# Patient Record
Sex: Female | Born: 1990 | Race: White | Hispanic: No | Marital: Married | Smoking: Current every day smoker
Health system: Southern US, Community
[De-identification: ages and names within clinical notes are randomized; demographics above are authoritative.]

## PROBLEM LIST (undated history)

## (undated) HISTORY — PX: OVARIAN CYST REMOVAL: SHX89

---
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
## 2009-02-27 ENCOUNTER — Emergency Department: Payer: Self-pay | Admitting: Emergency Medicine

## 2009-11-29 IMAGING — US US PELV - US TRANSVAGINAL
1 series · 17 of 25 positions shown · non-contrast
Comparison: none

REASON FOR EXAM: R pelvic pain, history of large ovarian cysts
COMMENTS:

PROCEDURE:     US  - US PELVIS MASS EXAM  - [DATE]  [DATE] [DATE]  [DATE]
RESULT:     Comparison: No available comparison exam.

[Series 1: us pelv - us transvaginal · 17 of 32 slices shown]
[im 1/32]
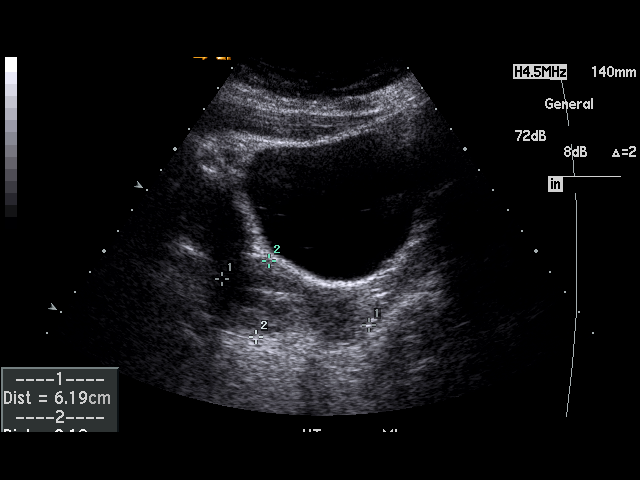
[im 3/32]
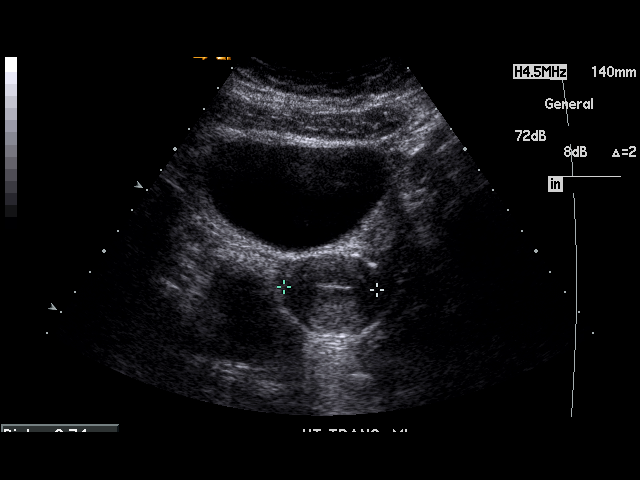
[im 4/32]
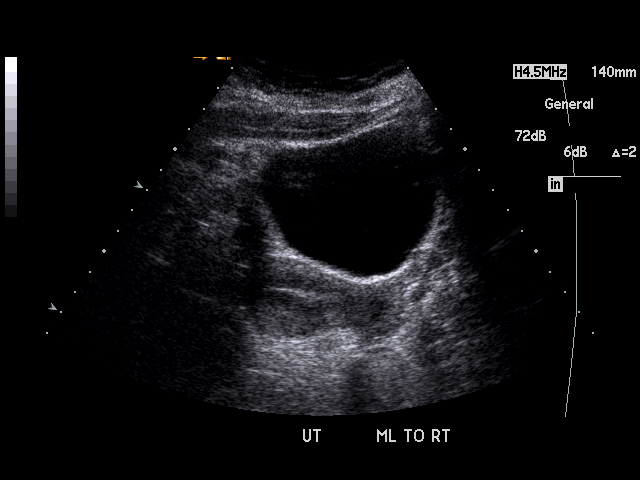
[im 7/32]
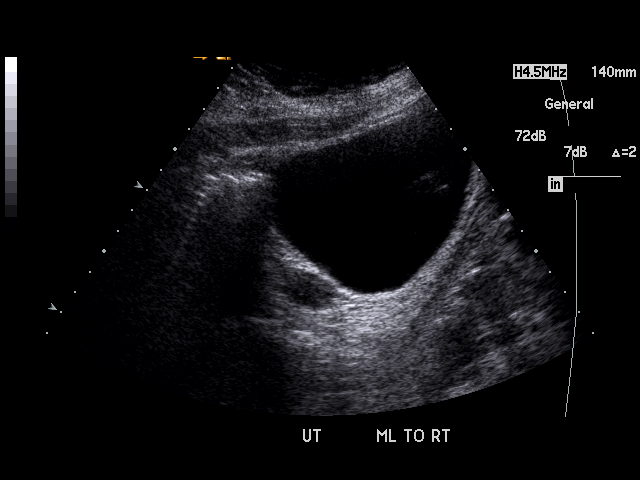
[im 8/32]
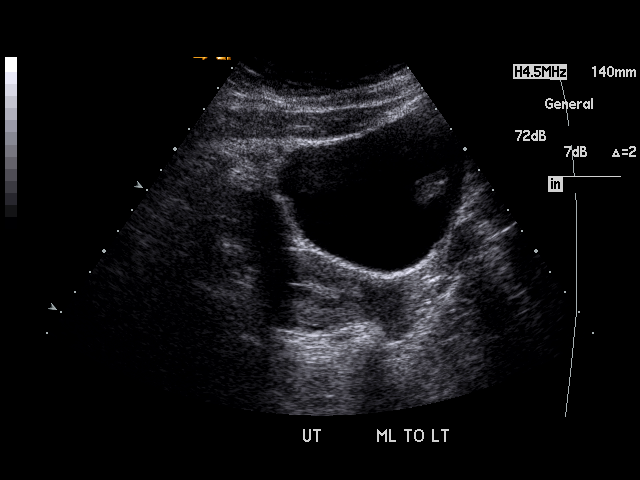
[im 11/32]
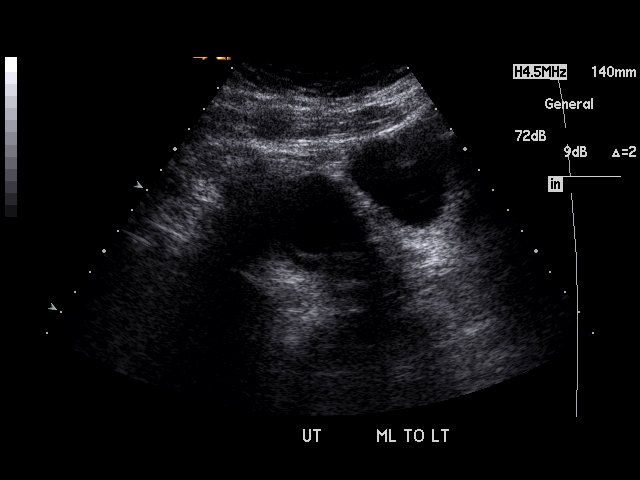
[im 12/32]
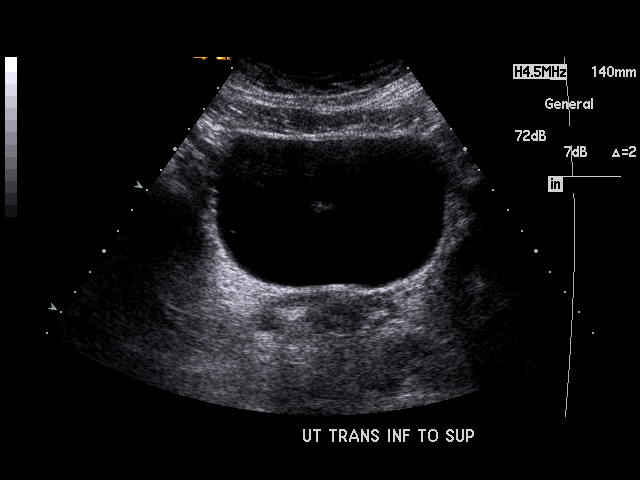
[im 15/32]
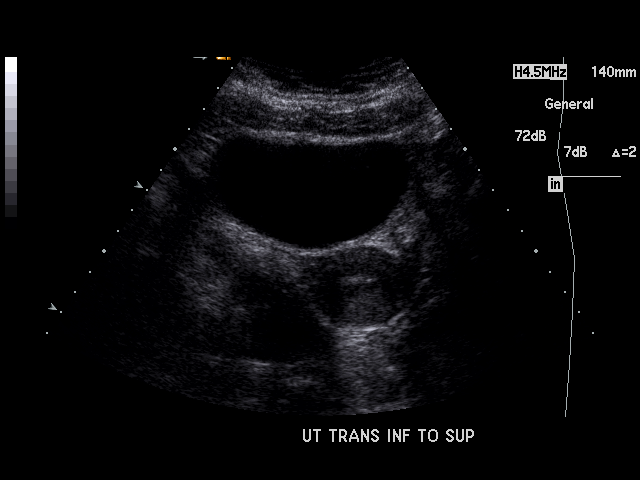
[im 16/32]
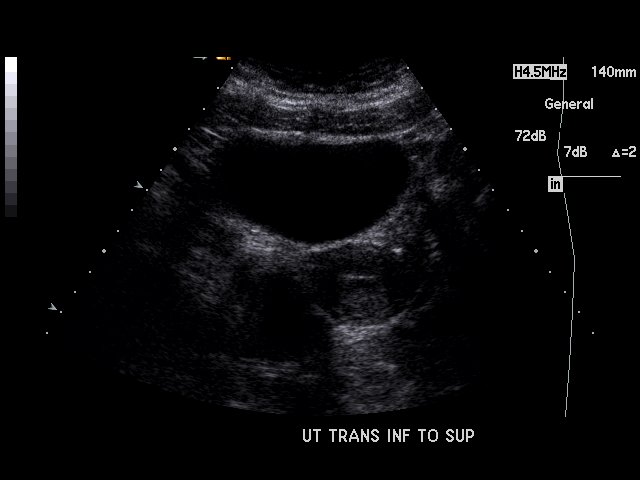
[im 17/32]
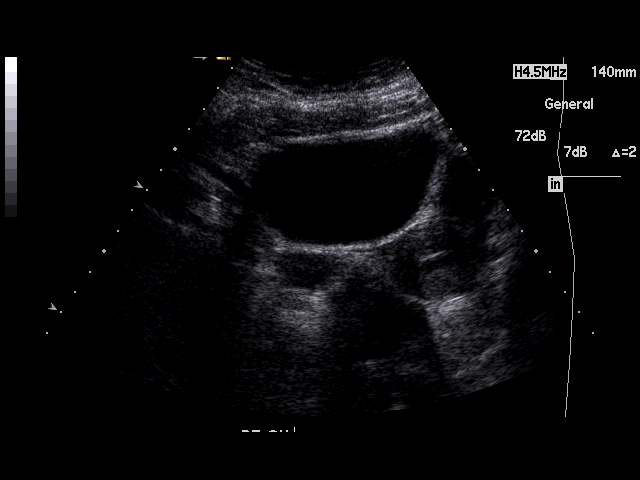
[im 20/32]
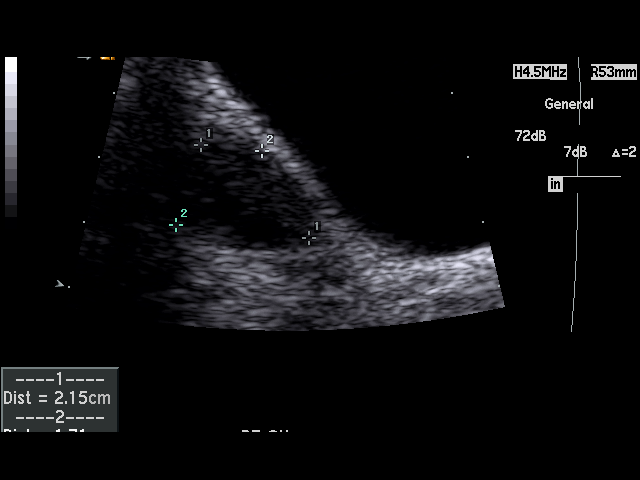
[im 21/32]
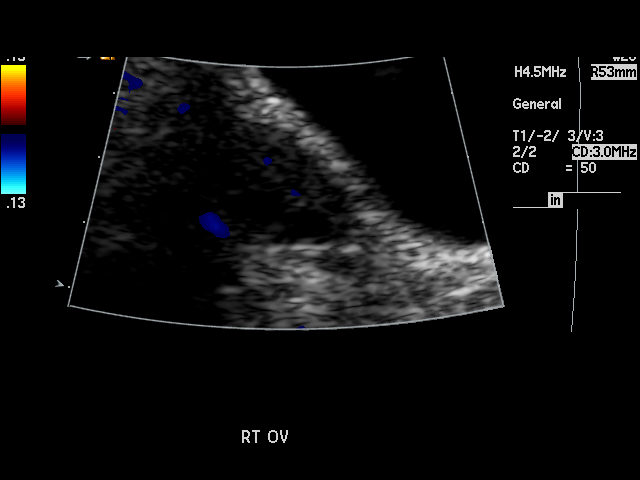
[im 24/32]
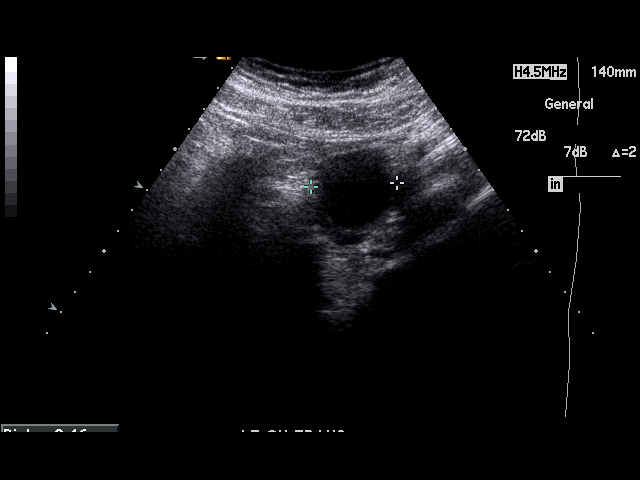
[im 25/32]
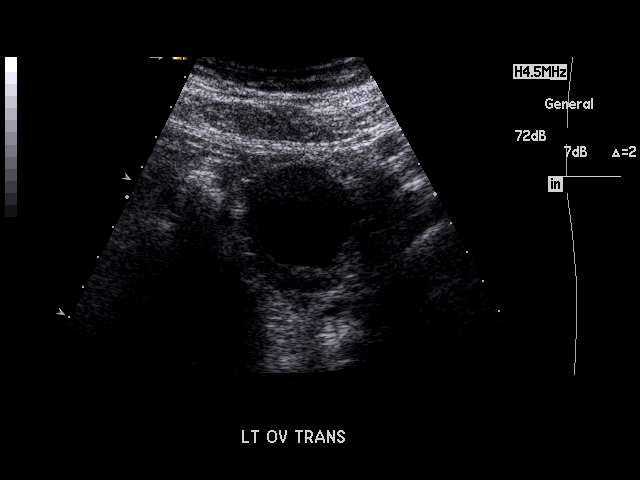
[im 28/32]
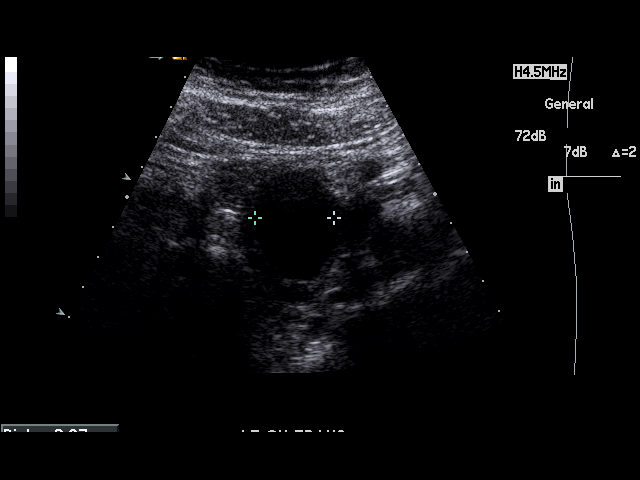
[im 29/32]
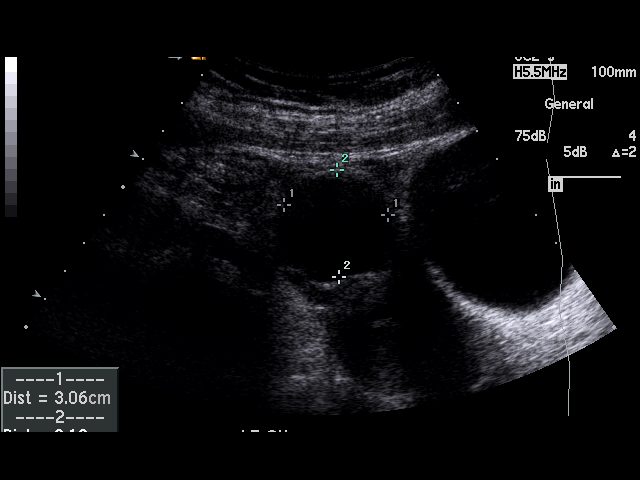
[im 32/32]
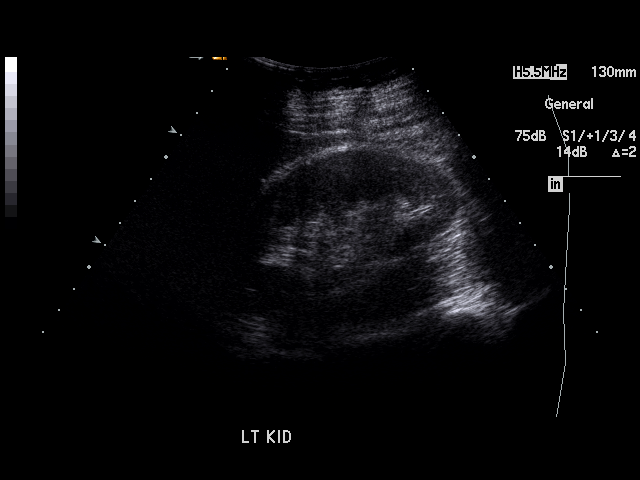

[17 of 25 positions shown; findings below may reference images not displayed]

FINDINGS: Transabdominal pelvic ultrasound was performed.

The uterus measures 6.2 x 3.1 x 3.7 cm. No focal mass is noted. The
endometrial strip measures 2 mm in thickness.

The right ovary measures 2.2 x 1.7 x 1.9 cm and the left measures 4.5 x
x 3.4 cm. A 3.1 x 3.1 x 2.4 cm left ovarian cyst is noted. Vascular flow is
seen in both ovaries. There is no significant pelvic free fluid.

There is no hydronephrosis.
IMPRESSION: 1. There is a 3.1 x 3.1 x 2.4 cm left ovarian cyst. The right ovary is
unremarkable.

Preliminary report was faxed to the emergency room by the night radiologist
shortly after the study was performed.

## 2009-12-24 IMAGING — US US PELV - US TRANSVAGINAL
1 series · 17 of 25 positions shown · non-contrast
Comparison: none

REASON FOR EXAM: pelvic pain  rm 17
COMMENTS:   LMP: unsure

[Series 1: us pelv - us transvaginal · 17 of 87 slices shown]
[im 1/87]
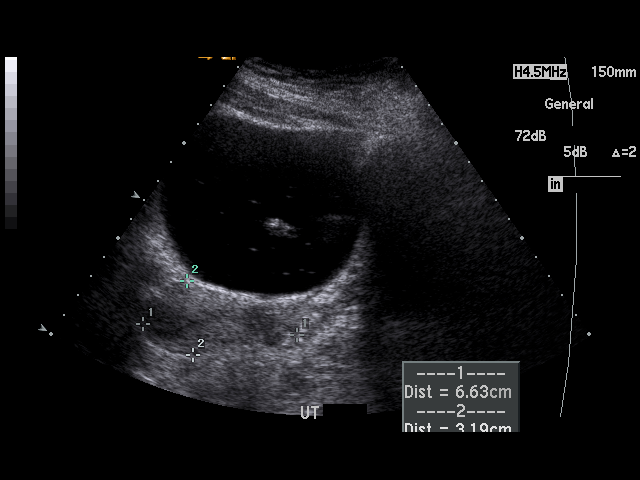
[im 8/87]
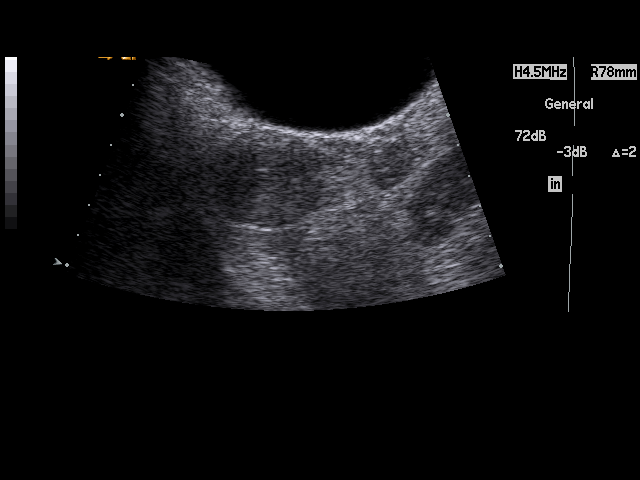
[im 11/87]
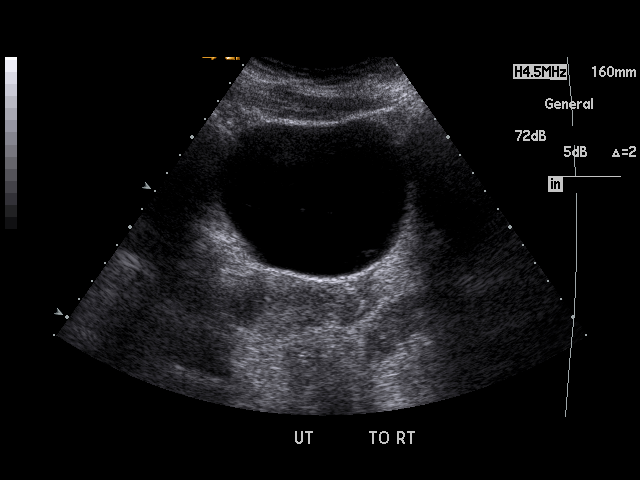
[im 18/87]
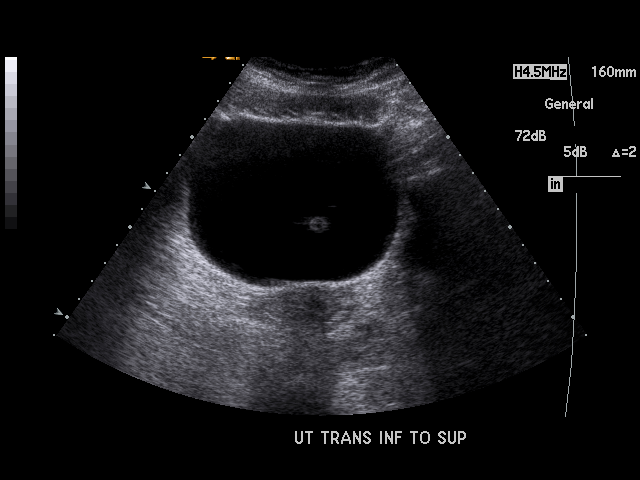
[im 22/87]
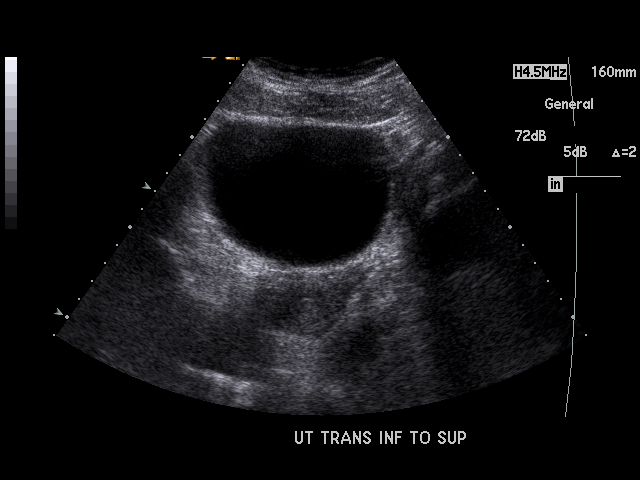
[im 29/87]
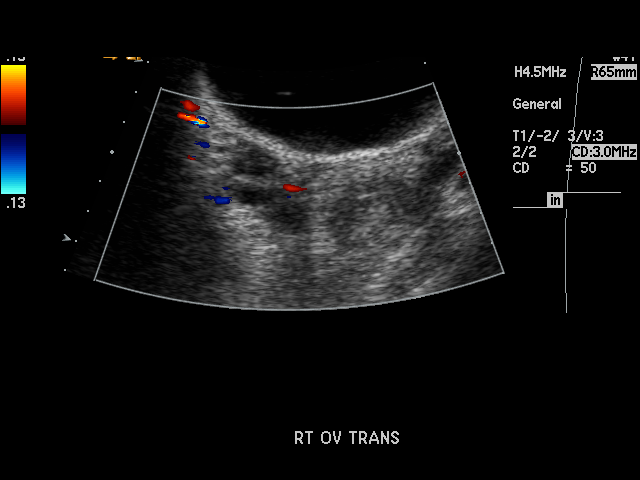
[im 33/87]
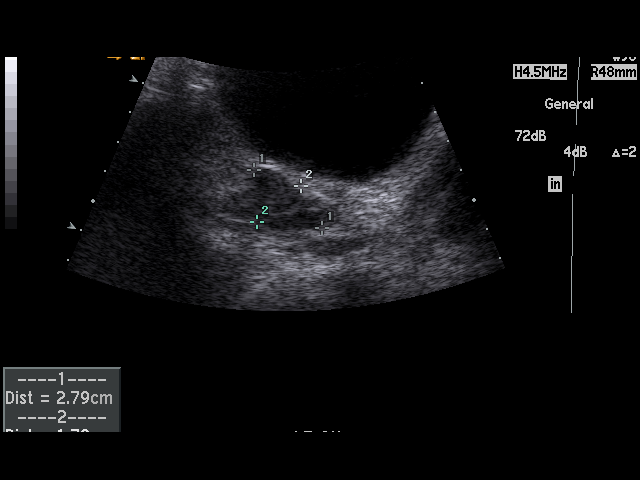
[im 40/87]
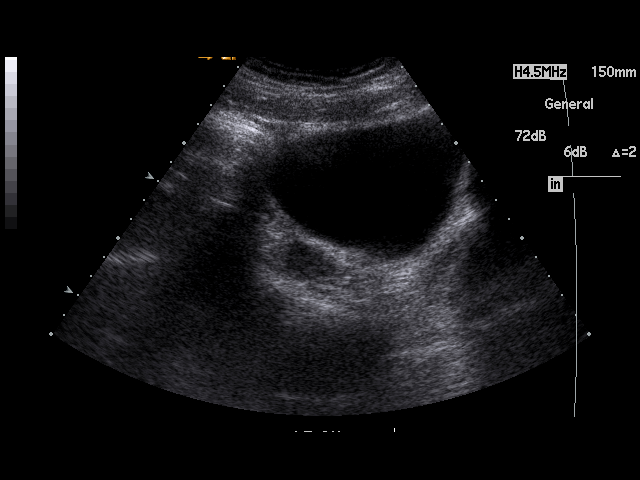
[im 44/87]
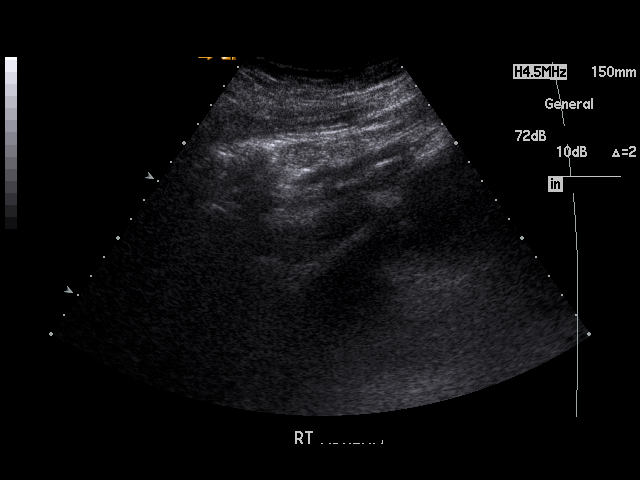
[im 47/87]
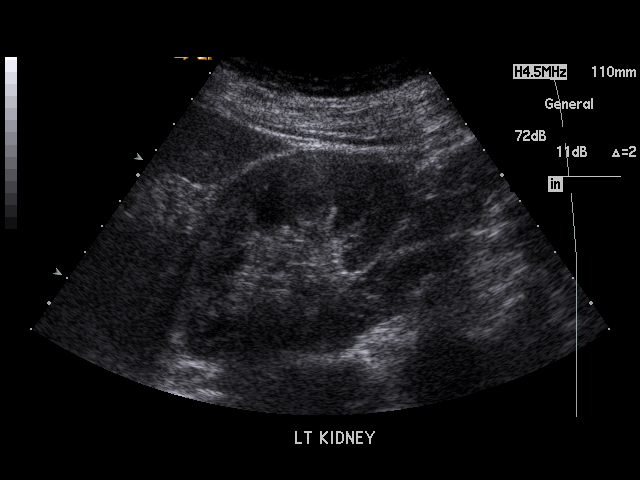
[im 54/87]
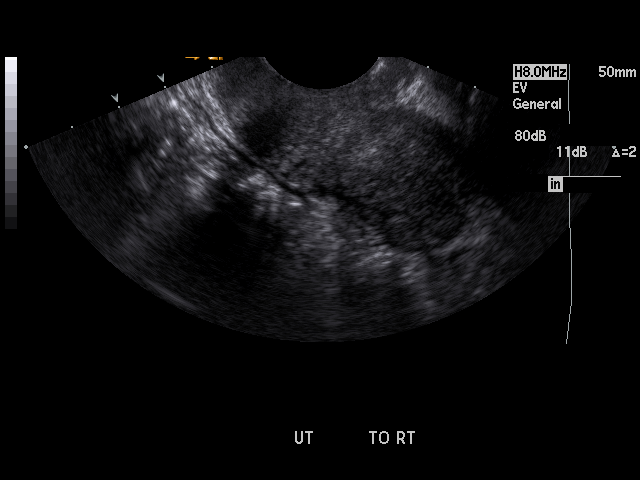
[im 58/87]
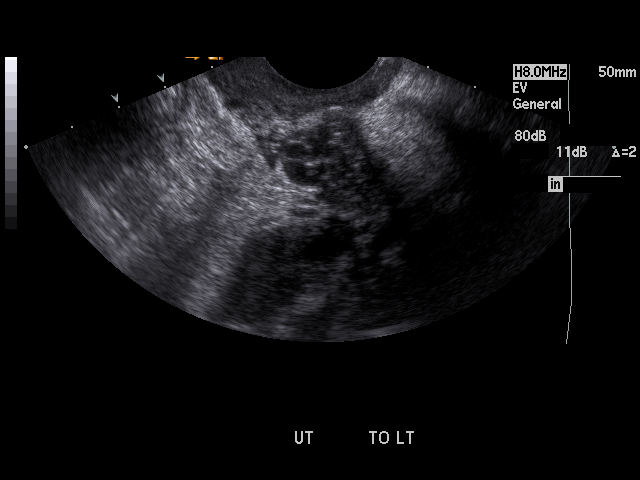
[im 65/87]
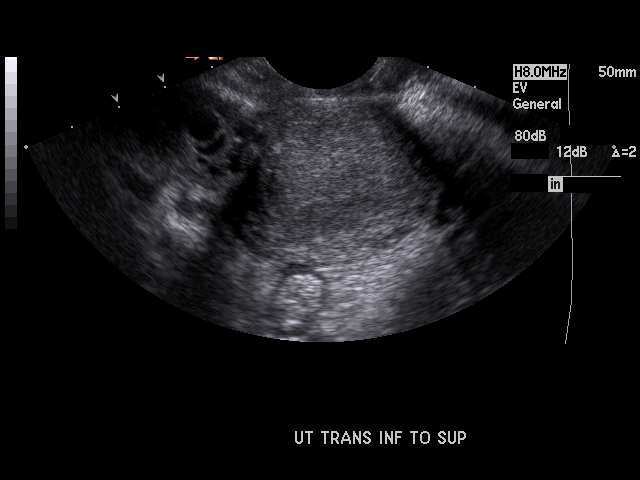
[im 69/87]
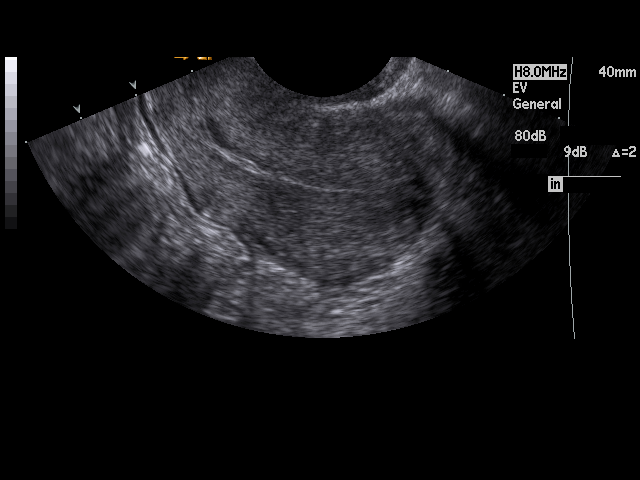
[im 76/87]
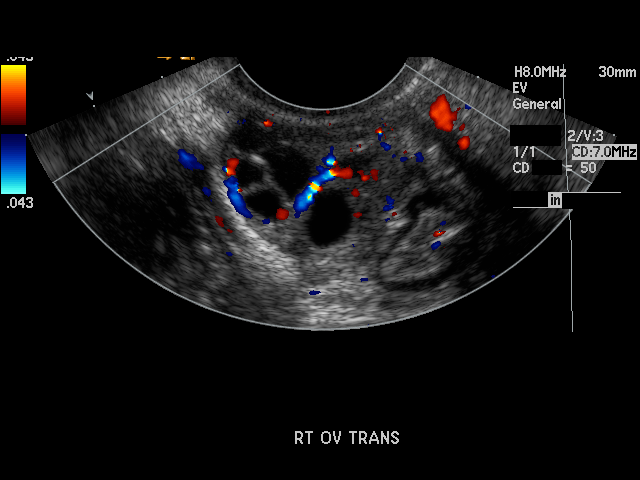
[im 79/87]
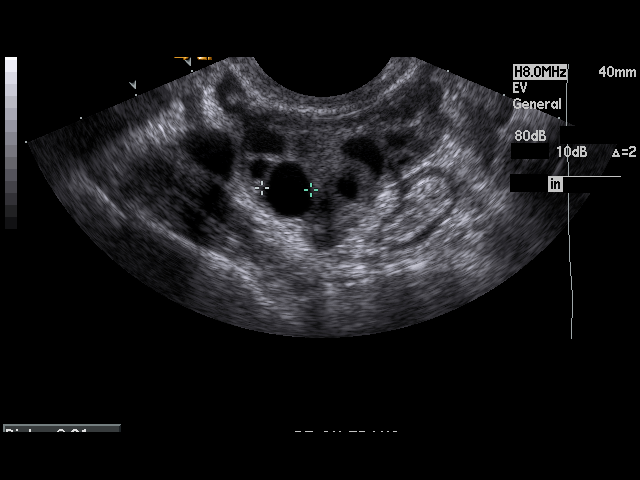
[im 87/87]
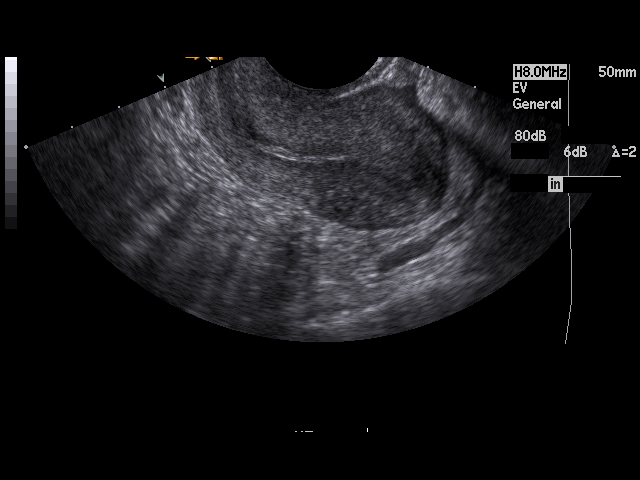

[17 of 25 positions shown; findings below may reference images not displayed]

PROCEDURE:     US  - US PELVIS MASS EXAM W/TRANSVAGI  - December 31, 2007 [DATE]

RESULT:     The uterus measures 5.9 x 3.3 x 3.2 cm.  The ovaries demonstrate
cysts bilaterally. There is no hydronephrosis.

The largest cyst on the RIGHT measures approximately 1 cm.  No free fluid is
noted.  The endometrium is unremarkable.
IMPRESSION: 1.Small ovarian cysts, otherwise, unremarkable exam.  The uterus is noted to
be retroverted.

## 2010-12-01 IMAGING — US TRANSABDOMINAL ULTRASOUND OF PELVIS
1 series · 17 of 25 positions shown · non-contrast
Comparison: none

REASON FOR EXAM: pain right lower quadrant
COMMENTS:

[Series 1: transabdominal ultrasound of pelvis · 17 of 34 slices shown]
[im 1/34]
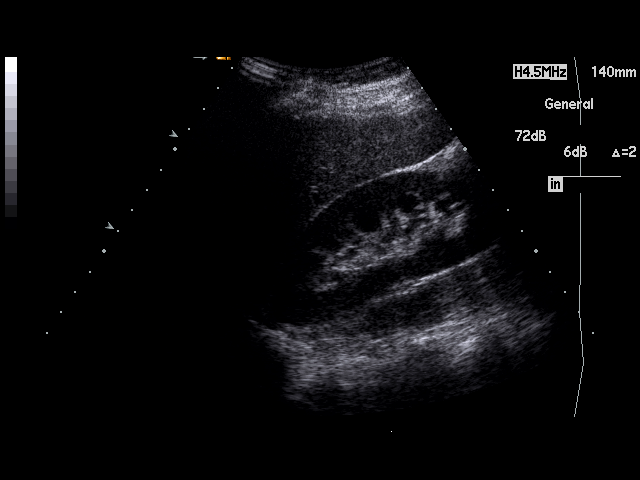
[im 3/34]
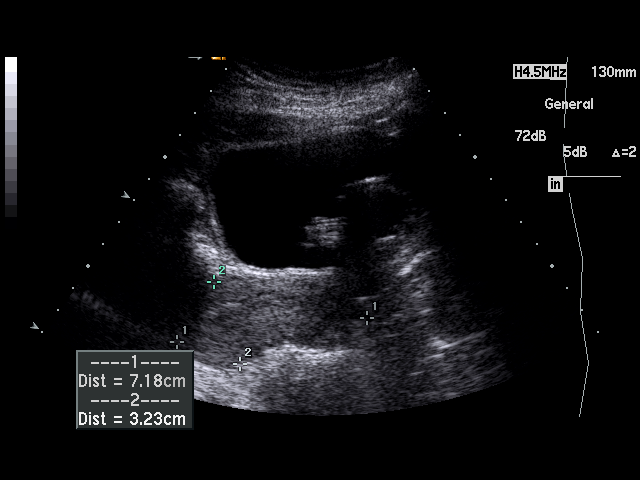
[im 5/34]
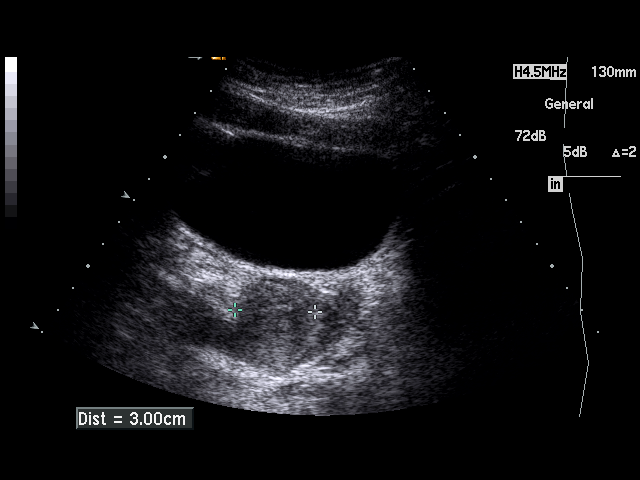
[im 7/34]
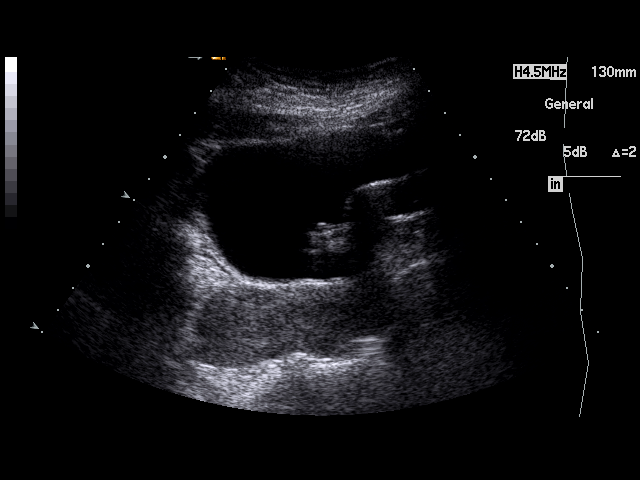
[im 9/34]
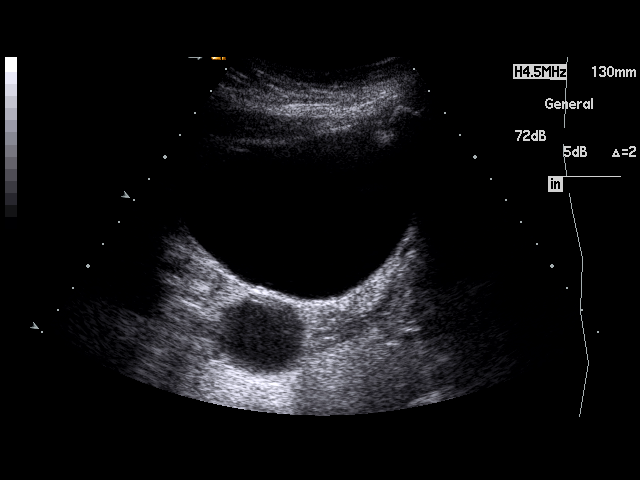
[im 12/34]
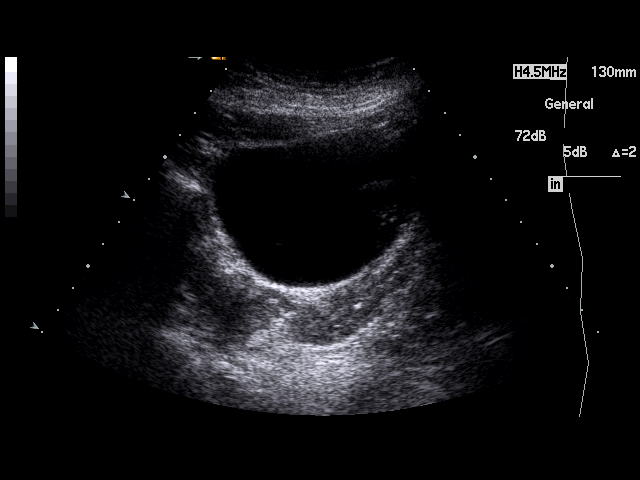
[im 13/34]
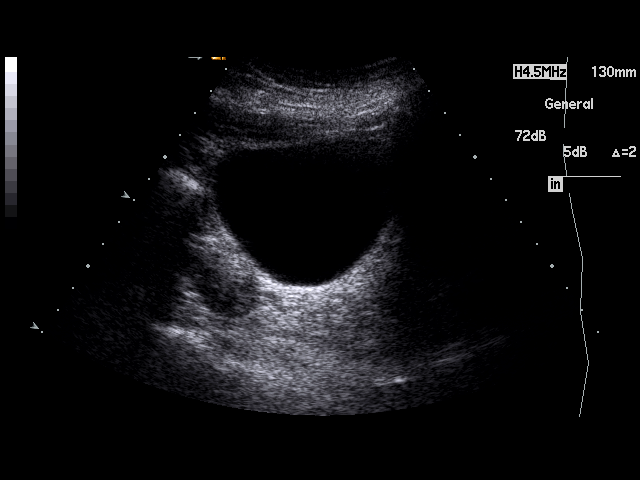
[im 16/34]
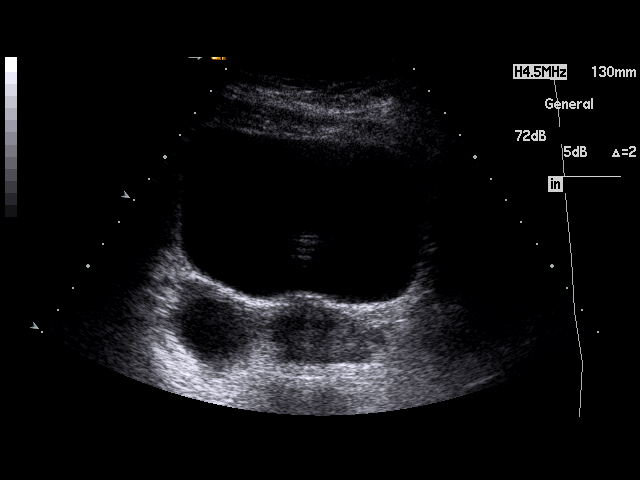
[im 17/34]
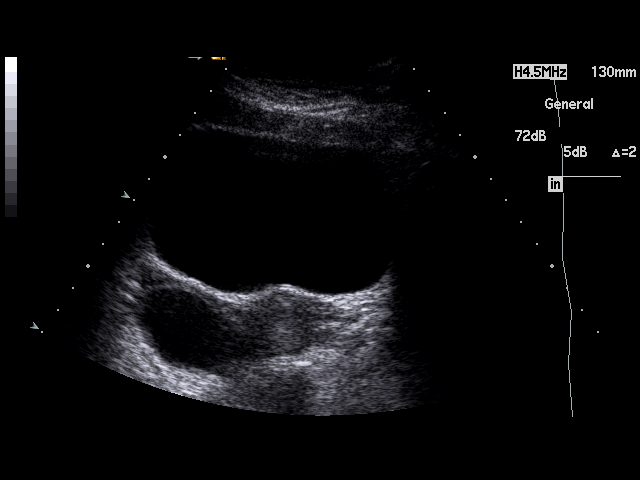
[im 18/34]
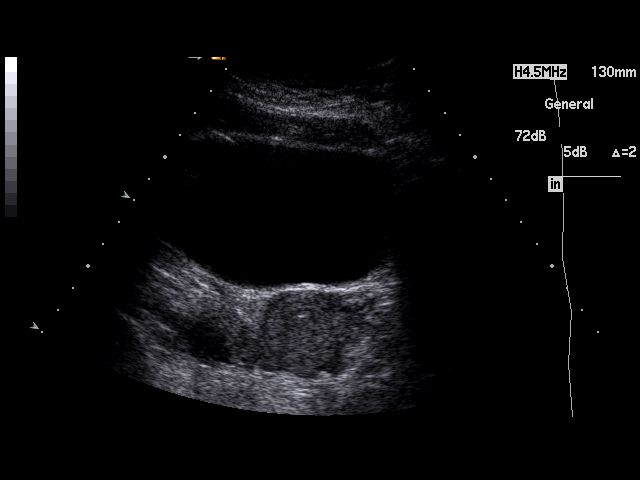
[im 21/34]
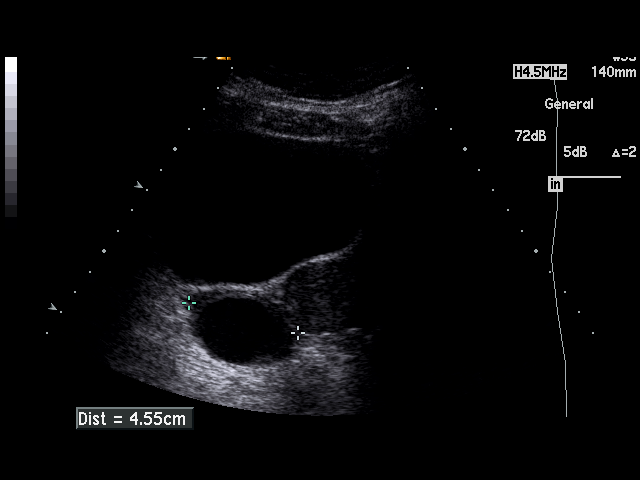
[im 23/34]
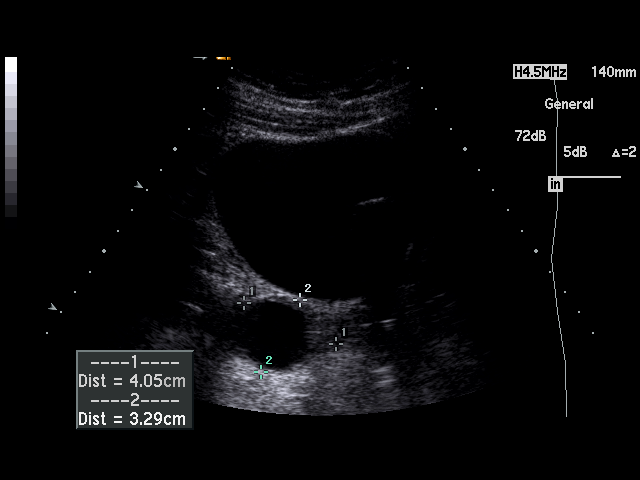
[im 25/34]
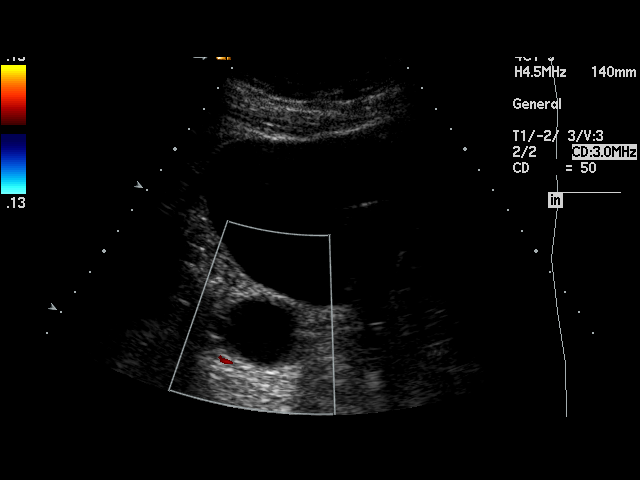
[im 27/34]
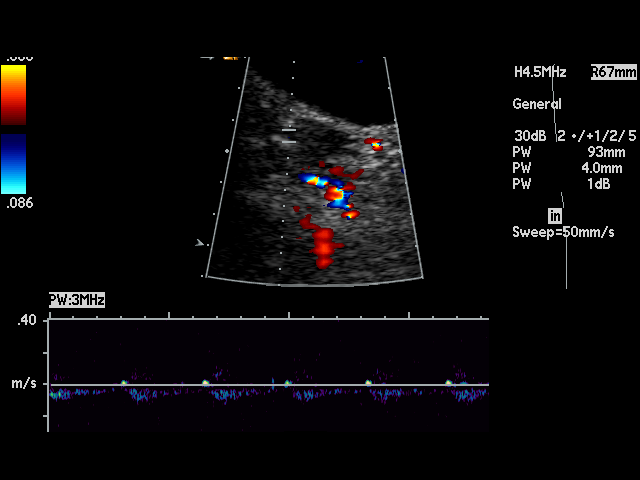
[im 29/34]
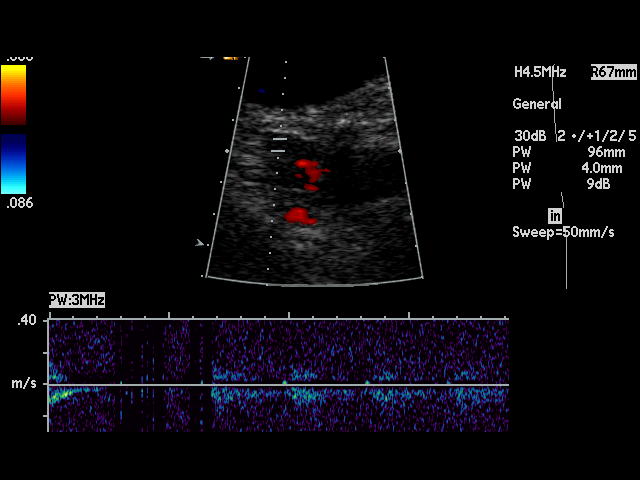
[im 31/34]
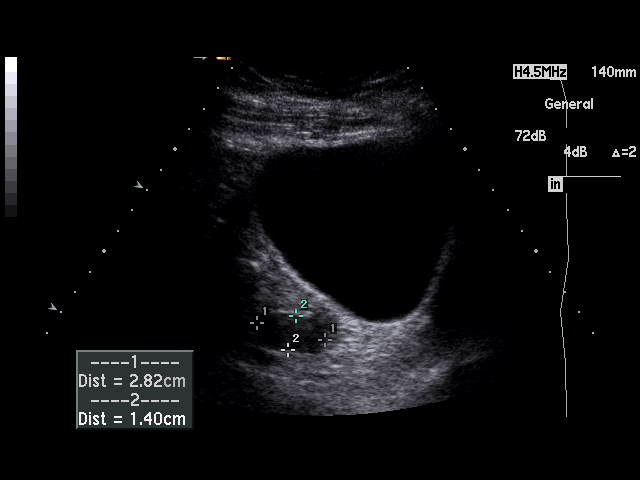
[im 34/34]
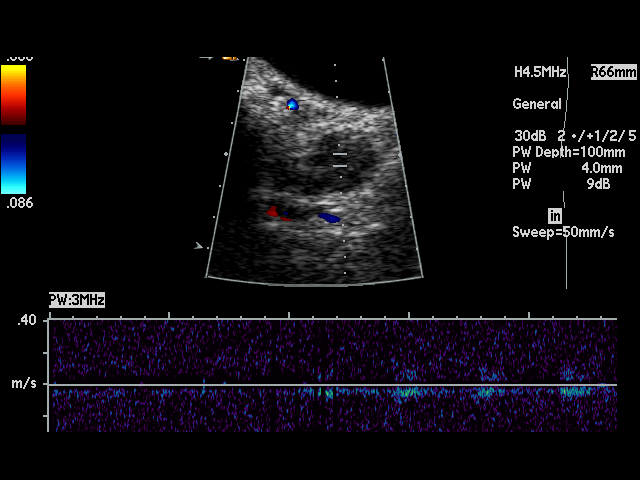

[17 of 25 positions shown; findings below may reference images not displayed]

PROCEDURE:     US  - US PELVIS MASS EXAM  - [DATE]  [DATE] [DATE]  [DATE]

RESULT:     Ultrasound of the pelvis is performed for right lower quadrant
pain. Transabdominal imaging demonstrates a simple cyst in the right ovary
measuring 2.7 x 2.4 x 2.9 cm. The ovaries show flow bilaterally which
appears to be symmetric and normal. The uterus measures 7.2 x 3.2 x 3.0 cm.
The endometrial stripe thickness is 3.4 mm. There is no free fluid. The
kidneys are grossly normal.
IMPRESSION: Simple, right ovarian cyst. No evidence of torsion.

## 2011-02-04 ENCOUNTER — Emergency Department: Payer: Self-pay | Admitting: Emergency Medicine

## 2011-06-11 ENCOUNTER — Emergency Department: Payer: Self-pay | Admitting: Internal Medicine

## 2011-06-17 ENCOUNTER — Emergency Department (HOSPITAL_COMMUNITY)
Admission: EM | Admit: 2011-06-17 | Discharge: 2011-06-17 | Disposition: A | Discharge status: Home / Self Care | Payer: Medicaid Other | Attending: Emergency Medicine | Admitting: Emergency Medicine

## 2011-06-17 ENCOUNTER — Encounter: Payer: Self-pay | Admitting: *Deleted

## 2011-06-17 DIAGNOSIS — F1120 Opioid dependence, uncomplicated: Secondary | ICD-10-CM | POA: Insufficient documentation

## 2011-06-17 DIAGNOSIS — F17200 Nicotine dependence, unspecified, uncomplicated: Secondary | ICD-10-CM | POA: Insufficient documentation

## 2011-06-17 DIAGNOSIS — F1110 Opioid abuse, uncomplicated: Secondary | ICD-10-CM

## 2011-06-17 LAB — RAPID URINE DRUG SCREEN, HOSP PERFORMED
Amphetamines: NOT DETECTED
Benzodiazepines: NOT DETECTED
Cocaine: NOT DETECTED
Opiates: POSITIVE — AB

## 2011-06-17 LAB — URINALYSIS, ROUTINE W REFLEX MICROSCOPIC
Glucose, UA: NEGATIVE mg/dL
pH: 6 (ref 5.0–8.0)

## 2011-06-17 LAB — URINE MICROSCOPIC-ADD ON

## 2011-06-17 MED ORDER — ONDANSETRON HCL 4 MG PO TABS: 8.0000 mg | ORAL_TABLET | Freq: Four times a day (QID) | ORAL | Status: AC

## 2011-06-17 MED ORDER — CLONIDINE HCL 0.2 MG PO TABS: 0.1000 mg | ORAL_TABLET | Freq: Three times a day (TID) | ORAL | Status: DC | PRN

## 2011-06-17 MED ORDER — ZOLPIDEM TARTRATE 5 MG PO TABS: 5.0000 mg | ORAL_TABLET | Freq: Every evening | ORAL | Status: DC | PRN

## 2011-06-17 NOTE — ED Provider Notes (Signed)
History     CSN: [id number] Arrival date & time: 06/17/2011  5:35 PM   First MD Initiated Contact with Patient 06/17/11 1809      Chief Complaint  Patient presents with  . 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  Last use was at 3:30 PM.  She requests detox.  She's had no other symptoms.  She reports she's attempted this before in the past on her own and has not had success and she is requesting an inpatient facility.  Nothing worsens her symptoms.  Nothing improves her symptoms.  Her symptoms are mild to moderate when they occur.  Otherwise without complaints. History reviewed. No pertinent past medical history.  History reviewed. No pertinent past surgical history.  No family history on file.  History  Substance Use Topics  . Smoking status: Current Everyday Smoker -- 1.0 packs/day  . Smokeless tobacco: Not on file  . Alcohol Use: No    OB History    Grav Para Term Preterm Abortions TAB SAB Ect Mult Living                  Review of Systems  All other systems reviewed and are negative.    Allergies  Penicillins and Haldol  Home Medications   Current Outpatient Rx  Name Route Sig Dispense Refill  . CLONIDINE HCL 0.2 MG PO TABS Oral Take 0.5 tablets (0.1 mg total) by mouth 3 (three) times daily as needed. 15 tablet 0  . ONDANSETRON HCL 4 MG PO TABS Oral Take 2 tablets (8 mg total) by mouth every 6 (six) hours. 30 tablet 0  . ZOLPIDEM TARTRATE 5 MG PO TABS Oral Take 1 tablet (5 mg total) by mouth at bedtime as needed for sleep. 7 tablet 0    BP 113/75  Pulse 108  Temp(Src) 98.2 F (36.8 C) (Oral)  Resp 20  SpO2 98%  Physical Exam  Constitutional: She is oriented to person, place, and time. She appears well-developed and well-nourished.  HENT:  Head: Normocephalic.  Eyes: EOM are normal.  Neck: Normal range of motion.    Pulmonary/Chest: Effort normal.  Musculoskeletal: Normal range of motion.  Neurological: She is alert and oriented to person, place, and time.  Psychiatric: She has a normal mood and affect.    ED Course  Procedures (including critical care time)  Labs Reviewed  URINALYSIS, ROUTINE W REFLEX MICROSCOPIC - Abnormal; Notable for the following:    Color, Urine AMBER (*) BIOCHEMICALS MAY BE AFFECTED BY COLOR   Appearance CLOUDY (*)    Hgb urine dipstick LARGE (*)    Ketones, ur 15 (*)    Urobilinogen, UA 2.0 (*)    Leukocytes, UA MODERATE (*)    All other components within normal limits  URINE MICROSCOPIC-ADD ON - Abnormal; Notable for the following:    Squamous Epithelial / LPF MANY (*)    Bacteria, UA FEW (*)    All other components within normal limits  POCT PREGNANCY, URINE  POCT PREGNANCY, URINE  URINE RAPID DRUG SCREEN (HOSP PERFORMED)  CBC  DIFFERENTIAL  COMPREHENSIVE METABOLIC PANEL  ETHANOL   No results found.   1. Narcotic abuse       MDM  Outpatient narcotic withdrawal medicines written.  Patient instructed to return to the ER for worsening symptoms including severe nausea vomiting and inability to keep fluids down.  I recommended that she contact someone from [employer]arc[employer] for  sponsorship.         Lyanne Co, MD 06/17/11 [phone]

## 2011-06-17 NOTE — ED Notes (Signed)
Patient here requesting DETOX from heroin.  Patient has been using heroin for about 5 years.  Last use was today at around 3:30 pm

## 2011-07-24 ENCOUNTER — Emergency Department: Payer: Self-pay | Admitting: Emergency Medicine

## 2013-01-10 ENCOUNTER — Emergency Department: Payer: Self-pay | Admitting: Emergency Medicine

## 2013-01-10 LAB — URINALYSIS, COMPLETE
Glucose,UR: NEGATIVE mg/dL (ref 0–75)
Protein: NEGATIVE
RBC,UR: 183 /HPF (ref 0–5)
WBC UR: 3 /HPF (ref 0–5)

## 2013-01-10 LAB — COMPREHENSIVE METABOLIC PANEL
Anion Gap: 9 (ref 7–16)
BUN: 7 mg/dL (ref 7–18)
Bilirubin,Total: 0.4 mg/dL (ref 0.2–1.0)
Calcium, Total: 9.2 mg/dL (ref 8.5–10.1)
EGFR (African American): >60
EGFR (Non-African Amer.): >60
Glucose: 136 mg/dL — ABNORMAL HIGH (ref 65–99)
Osmolality: 281 (ref 275–301)
Potassium: 3.3 mmol/L — ABNORMAL LOW (ref 3.5–5.1)
SGPT (ALT): 37 U/L (ref 12–78)
Sodium: 141 mmol/L (ref 136–145)

## 2013-01-10 LAB — DRUG SCREEN, URINE
Amphetamines, Ur Screen: NEGATIVE (ref ?–1000)
Barbiturates, Ur Screen: NEGATIVE (ref ?–200)
Cocaine Metabolite,Ur ~~LOC~~: POSITIVE (ref ?–300)
Tricyclic, Ur Screen: NEGATIVE (ref ?–1000)

## 2013-01-10 LAB — CBC
Platelet: 210 10*3/uL (ref 150–440)
RDW: 12.7 % (ref 11.5–14.5)

## 2015-09-22 ENCOUNTER — Encounter: Payer: Self-pay | Admitting: *Deleted

## 2015-09-22 ENCOUNTER — Ambulatory Visit (INDEPENDENT_AMBULATORY_CARE_PROVIDER_SITE_OTHER): Payer: Medicaid Other | Admitting: *Deleted

## 2015-09-22 DIAGNOSIS — O3680X0 Pregnancy with inconclusive fetal viability, not applicable or unspecified: Secondary | ICD-10-CM

## 2015-09-22 DIAGNOSIS — Z3201 Encounter for pregnancy test, result positive: Secondary | ICD-10-CM

## 2015-09-22 DIAGNOSIS — Z3200 Encounter for pregnancy test, result unknown: Secondary | ICD-10-CM

## 2015-09-22 LAB — POCT PREGNANCY, URINE: PREG TEST UR: POSITIVE — AB

## 2015-09-22 NOTE — Progress Notes (Signed)
Here for pregnancy test which was positive . Gave her letter for proof of pregnancy. Would like to get prenatal care in this clinic. Prefers to get bloodwork at first ob visit, not today.  Patient states has irregular periods- is unsure how far along she is- ultrasound ordered. Will schedule new ob appt.

## 2015-09-29 ENCOUNTER — Ambulatory Visit (HOSPITAL_COMMUNITY): Payer: Medicaid Other

## 2015-10-04 ENCOUNTER — Ambulatory Visit (HOSPITAL_COMMUNITY)
Admission: RE | Admit: 2015-10-04 | Discharge: 2015-10-04 | Disposition: A | Discharge status: Home / Self Care | Payer: Medicaid Other | Source: Ambulatory Visit | Attending: Obstetrics and Gynecology | Admitting: Obstetrics and Gynecology

## 2015-10-04 DIAGNOSIS — Z36 Encounter for antenatal screening of mother: Secondary | ICD-10-CM | POA: Insufficient documentation

## 2015-10-04 DIAGNOSIS — Z3A01 Less than 8 weeks gestation of pregnancy: Secondary | ICD-10-CM | POA: Insufficient documentation

## 2015-10-04 DIAGNOSIS — O3680X0 Pregnancy with inconclusive fetal viability, not applicable or unspecified: Secondary | ICD-10-CM

## 2015-10-05 ENCOUNTER — Telehealth: Payer: Self-pay | Admitting: General Practice

## 2015-10-05 DIAGNOSIS — Z3490 Encounter for supervision of normal pregnancy, unspecified, unspecified trimester: Secondary | ICD-10-CM

## 2015-10-05 NOTE — Telephone Encounter (Signed)
Patient called into office requesting ultrasound results. Per Dr Jolayne Pantheronstant, patient needs follow up ultrasound in 2 weeks. Informed patient of ultrasound results and need for follow up in 2 weeks. Scheduled ultrasound for 3/21. Patient verbalized understanding to all & had no questions

## 2015-10-07 ENCOUNTER — Other Ambulatory Visit: Payer: Self-pay | Admitting: General Practice

## 2015-10-07 DIAGNOSIS — Z3490 Encounter for supervision of normal pregnancy, unspecified, unspecified trimester: Secondary | ICD-10-CM

## 2015-10-14 ENCOUNTER — Ambulatory Visit (INDEPENDENT_AMBULATORY_CARE_PROVIDER_SITE_OTHER): Payer: Medicaid Other | Admitting: Family Medicine

## 2015-10-14 ENCOUNTER — Encounter: Payer: Self-pay | Admitting: Family Medicine

## 2015-10-14 VITALS — BP 125/86 | HR 101 | Temp 98.4°F | Ht 63.0 in | Wt 173.6 lb

## 2015-10-14 DIAGNOSIS — Z3490 Encounter for supervision of normal pregnancy, unspecified, unspecified trimester: Secondary | ICD-10-CM | POA: Diagnosis not present

## 2015-10-14 LAB — POCT URINALYSIS DIP (DEVICE)
Glucose, UA: NEGATIVE mg/dL
HGB URINE DIPSTICK: NEGATIVE
KETONES UR: NEGATIVE mg/dL
LEUKOCYTES UA: NEGATIVE
Nitrite: NEGATIVE
PH: 6 (ref 5.0–8.0)
Protein, ur: NEGATIVE mg/dL
Specific Gravity, Urine: 1.025 (ref 1.005–1.030)
Urobilinogen, UA: 0.2 mg/dL (ref 0.0–1.0)

## 2015-10-14 NOTE — Progress Notes (Signed)
New ob packet given  Early glucola given due to BMI >30 Flu vaccine consented and given

## 2015-10-14 NOTE — Progress Notes (Signed)
Patient not seen - needs viability [place]

## 2015-10-14 NOTE — Addendum Note (Signed)
Addended by: Faythe CasaBELLAMY, Thaddeaus Monica M on: 10/14/2015 01:03 PM   Modules accepted: Orders

## 2015-10-19 ENCOUNTER — Other Ambulatory Visit: Payer: Self-pay | Admitting: General Practice

## 2015-10-19 ENCOUNTER — Ambulatory Visit (INDEPENDENT_AMBULATORY_CARE_PROVIDER_SITE_OTHER): Payer: Medicaid Other | Admitting: Advanced Practice Midwife

## 2015-10-19 ENCOUNTER — Ambulatory Visit (HOSPITAL_COMMUNITY)
Admission: RE | Admit: 2015-10-19 | Discharge: 2015-10-19 | Disposition: A | Discharge status: Home / Self Care | Payer: Medicaid Other | Source: Ambulatory Visit | Attending: Obstetrics and Gynecology | Admitting: Obstetrics and Gynecology

## 2015-10-19 DIAGNOSIS — O020 Blighted ovum and nonhydatidiform mole: Secondary | ICD-10-CM

## 2015-10-19 DIAGNOSIS — Z3A01 Less than 8 weeks gestation of pregnancy: Secondary | ICD-10-CM | POA: Insufficient documentation

## 2015-10-19 DIAGNOSIS — O283 Abnormal ultrasonic finding on antenatal screening of mother: Secondary | ICD-10-CM | POA: Diagnosis not present

## 2015-10-19 DIAGNOSIS — O3680X0 Pregnancy with inconclusive fetal viability, not applicable or unspecified: Secondary | ICD-10-CM

## 2015-10-19 DIAGNOSIS — O208 Other hemorrhage in early pregnancy: Secondary | ICD-10-CM | POA: Diagnosis not present

## 2015-10-19 NOTE — Patient Instructions (Signed)
Blighted Ovum A blighted ovum is a common kind of early pregnancy failure. It happens when a fertilized egg attaches to the uterus but stops growing. Even though the egg never develops, the body acts like it is pregnant. A sac starts to form around the egg, and tissue to support a baby starts to form in the placenta. CAUSES This condition is usually caused by a genetic defect in the egg. SYMPTOMS Early symptoms of this condition are the same as those of early pregnancy. They include:  A missed menstrual period.  Fatigue.  Feeling sick to your stomach (nauseous).  Sore breasts. Later symptoms are those of pregnancy loss. They include:  Abdominal cramps.  Vaginal bleeding or spotting.  A menstrual period that is heavier than usual. DIAGNOSIS This condition is usually diagnosed during a routine ultrasound. It can be confirmed with blood tests. TREATMENT This condition may be treated by:  Waiting until your body naturally gets rid of the empty egg sac and placenta (miscarriage).  Taking medicine to start a miscarriage. This medicine can be taken by mouth or placed into the vagina.  Having a surgical procedure to remove the tissue. Your health care provider would open the entrance to your womb (dilation) and remove the tissue (curettage). HOME CARE INSTRUCTIONS  Take over-the-counter and prescription medicines only as told by your health care provider.  Talk to your health care provider about when you can try to get pregnant again. Having this condition does not mean you will lose future pregnancies.  After your miscarriage:  Rest at home for a few days.  You may bleed heavily for a week or more, and you may have light bleeding for a couple weeks after that. Wear a pad until vaginal bleeding stops. SEEK MEDICAL CARE IF:  You have a fever or chills.  Your pain medicine is not helping.  You have vaginal bleeding that continues for longer than expected. SEEK IMMEDIATE MEDICAL  CARE IF:  You have severe abdominal pain.  You feel dizzy or faint.  You pass out.  You have very heavy vaginal bleeding. A sign that vaginal bleeding is very heavy is if blood soaks through two large sanitary pads an hour for more than two hours.   This information is not intended to replace advice given to you by your health care provider. Make sure you discuss any questions you have with your health care provider.   Document Released: 11/01/2010 Document Revised: 04/07/2015 Document Reviewed: 12/02/2014 Elsevier Interactive Patient Education [employer]  Miscarriage A miscarriage is the sudden loss of an unborn baby (fetus) before the 20th week of pregnancy. Most miscarriages happen in the first 3 months of pregnancy. Sometimes, it happens before a woman even knows she is pregnant. A miscarriage is also called a "spontaneous miscarriage" or "early pregnancy loss." Having a miscarriage can be an emotional experience. Talk with your caregiver about any questions you may have about miscarrying, the grieving process, and your future pregnancy plans. CAUSES   Problems with the fetal chromosomes that make it impossible for the baby to develop normally. Problems with the baby's genes or chromosomes are most often the result of errors that occur, by chance, as the embryo divides and grows. The problems are not inherited from the parents.  Infection of the cervix or uterus.   Hormone problems.   Problems with the cervix, such as having an incompetent cervix. This is when the tissue in the cervix is not strong enough to hold the  pregnancy.   Problems with the uterus, such as an abnormally shaped uterus, uterine fibroids, or congenital abnormalities.   Certain medical conditions.   Smoking, drinking alcohol, or taking illegal drugs.   Trauma.  Often, the cause of a miscarriage is unknown.  SYMPTOMS   Vaginal bleeding or spotting, with or without cramps or pain.  Pain or  cramping in the abdomen or lower back.  Passing fluid, tissue, or blood clots from the vagina. DIAGNOSIS  Your caregiver will perform a physical exam. You may also have an ultrasound to confirm the miscarriage. Blood or urine tests may also be ordered. TREATMENT   Sometimes, treatment is not necessary if you naturally pass all the fetal tissue that was in the uterus. If some of the fetus or placenta remains in the body (incomplete miscarriage), tissue left behind may become infected and must be removed. Usually, a dilation and curettage (D and C) procedure is performed. During a D and C procedure, the cervix is widened (dilated) and any remaining fetal or placental tissue is gently removed from the uterus.  Antibiotic medicines are prescribed if there is an infection. Other medicines may be given to reduce the size of the uterus (contract) if there is a lot of bleeding.  If you have Rh negative blood and your baby was Rh positive, you will need a Rh immunoglobulin shot. This shot will protect any future baby from having Rh blood problems in future pregnancies. HOME CARE INSTRUCTIONS   Your caregiver may order bed rest or may allow you to continue light activity. Resume activity as directed by your caregiver.  Have someone help with home and family responsibilities during this time.   Keep track of the number of sanitary pads you use each day and how soaked (saturated) they are. Write down this information.   Do not use tampons. Do not douche or have sexual intercourse until approved by your caregiver.   Only take over-the-counter or prescription medicines for pain or discomfort as directed by your caregiver.   Do not take aspirin. Aspirin can cause bleeding.   Keep all follow-up appointments with your caregiver.   If you or your partner have problems with grieving, talk to your caregiver or seek counseling to help cope with the pregnancy loss. Allow enough time to grieve before  trying to get pregnant again.  SEEK IMMEDIATE MEDICAL CARE IF:   You have severe cramps or pain in your back or abdomen.  You have a fever.  You pass large blood clots (walnut-sized or larger) ortissue from your vagina. Save any tissue for your caregiver to inspect.   Your bleeding increases.   You have a thick, bad-smelling vaginal discharge.  You become lightheaded, weak, or you faint.   You have chills.  MAKE SURE YOU:  Understand these instructions.  Will watch your condition.  Will get help right away if you are not doing well or get worse.   This information is not intended to replace advice given to you by your health care provider. Make sure you discuss any questions you have with your health care provider.   Document Released: 01/10/2001 Document Revised: 11/11/2012 Document Reviewed: 09/05/2011 Elsevier Interactive Patient Education [employer] Else[employer] Inc[employer]

## 2015-10-19 NOTE — Progress Notes (Signed)
Ultrasounds Results Note  SUBJECTIVE HPI:  Ms. Alverda Skeansmanda Cargle is a 25 y.o. G2P0010 at Unknown by LMP who [facility] followup ultrasound results. The patient denies abdominal pain or vaginal bleeding.  Upon review of the patient's records, patient was first seen in clinic on 2/22 with positive pregnancy test and viability US was ordered and completed on 3/6.  US on 3/6 showed gestational sac without yolk sac and gestational age by sac diameter as [id number].  Follow up US ordered in 2 weeks.  Repeat ultrasound was performed earlier today.   No past medical history on file. No past surgical history on file. Social History   Social History  . Marital Status: Single    Spouse Name: N/A  . Number of Children: N/A  . Years of Education: N/A   Occupational History  . Not on file.   Social History Main Topics  . Smoking status: Current Every Day Smoker -- 1.00 packs/day    Types: Cigarettes  . Smokeless tobacco: Never Used  . Alcohol Use: No  . Drug Use: No  . Sexual Activity: Yes    Birth Control/ Protection: None     Comment: heroin   Other Topics Concern  . Not on file   Social History Narrative   Current Outpatient Prescriptions on File Prior to Visit  Medication Sig Dispense Refill  . Prenatal Vit-Fe Fumarate-FA (PRENATAL VITAMINS PLUS) 27-1 MG TABS Take 1 tablet by mouth daily.     No current facility-administered medications on file prior to visit.   Allergies  Allergen Reactions  . Penicillins Anaphylaxis  . Haldol [Haloperidol Decanoate] Other (See Comments)    Seizures    I have reviewed patient's Past Medical Hx, Surgical Hx, Family Hx, Social Hx, medications and allergies.   Review of Systems Review of Systems  Constitutional: Negative for fever and chills.  Gastrointestinal: Negative for nausea, vomiting, abdominal pain, diarrhea and constipation.  Genitourinary: Negative for dysuria.  Musculoskeletal: Negative for back pain.   Neurological: Negative for dizziness and weakness.    Physical Exam  There were no vitals taken for this visit.  GENERAL: Well-developed, well-nourished female in no acute distress.  HEENT: Normocephalic, atraumatic.   LUNGS: Effort normal ABDOMEN: soft, non-tender HEART: Regular rate  SKIN: Warm, dry and without erythema PSYCH: Normal mood and affect NEURO: Alert and oriented x 4  LAB RESULTS No results found for this or any previous visit (from the past 24 hour(s)).  IMAGING [place] Ob Comp Less 14 Wks  10/04/2015  CLINICAL DATA:  Positive pregnancy test. EXAM: OBSTETRIC <14 WK US AND TRANSVAGINAL OB US TECHNIQUE: Both transabdominal and transvaginal ultrasound examinations were performed for complete evaluation of the gestation as well as the maternal uterus, adnexal regions, and pelvic cul-de-sac. Transvaginal technique was performed to assess early pregnancy. COMPARISON:  No recent prior. FINDINGS: Intrauterine gestational sac: Visualized/normal in shape. Yolk sac:  Not visualized. Embryo:  Not visualized. Cardiac Activity: Not visualized. MSD: 1.6 cm 6 w   3  d Subchorionic hemorrhage:  None visualized. Maternal uterus/adnexae: 2.2 cm complex cyst right ovary most likely corpus luteal cyst. IMPRESSION: Intrauterine gestational sac corresponding to 6 week 3 day pregnancy. Probable small right ovarian corpus luteal cyst. Probable early intrauterine gestational sac, but no yolk sac, fetal pole, or cardiac activity yet visualized. Recommend follow-up quantitative B-HCG levels and follow-up US in 14 days to confirm and assess viability. This recommendation follows SRU consensus guidelines: Diagnostic Criteria for Nonviable Pregnancy  Early in the First Trimester. Malva Limes Med 2013; 161:0960-45. Electronically Signed   By: Maisie Fus  Register   On: 10/04/2015 08:38   US Ob Transvaginal  10/19/2015  CLINICAL DATA:  Pregnant, inconclusive viability EXAM: TRANSVAGINAL OB ULTRASOUND TECHNIQUE: Transvaginal  ultrasound was performed for complete evaluation of the gestation as well as the maternal uterus, adnexal regions, and pelvic cul-de-sac. COMPARISON:  10/04/2015 FINDINGS: Intrauterine gestational sac: Visualized, irregular in shape. Yolk sac:  Not visualized. Embryo:  Not visualized. MSD: 14.8  mm   6 w   2  d Note: Gestational sac measures 16 mm at the time of prior ultrasound, indicating complete lack of growth. Subchorionic hemorrhage:  Large subchronic hemorrhage. Maternal uterus/adnexae: Bilateral ovaries are within normal limits. No free fluid. IMPRESSION: Irregular gestational sac without yolk sac or fetal pole. No progression from the prior ultrasound. Findings meet definitive criteria for failed pregnancy, based on nonvisualization of a yolk sac 14 days following ultrasound documenting the presence of a gestational sac. This follows SRU consensus guidelines: Diagnostic Criteria for Nonviable Pregnancy Early in the First Trimester. Macy Mis J Med [phone]. Electronically Signed   By: Charline Bills M.D.   On: 10/19/2015 15:34   US Ob Transvaginal  10/04/2015  CLINICAL DATA:  Positive pregnancy test. EXAM: OBSTETRIC <14 WK [place] AND TRANSVAGINAL OB US TECHNIQUE: Both transabdominal and transvaginal ultrasound examinations were performed for complete evaluation of the gestation as well as the maternal uterus, adnexal regions, and pelvic cul-de-sac. Transvaginal technique was performed to assess early pregnancy. COMPARISON:  No recent prior. FINDINGS: Intrauterine gestational sac: Visualized/normal in shape. Yolk sac:  Not visualized. Embryo:  Not visualized. Cardiac Activity: Not visualized. MSD: 1.6 cm 6 w   3  d Subchorionic hemorrhage:  None visualized. Maternal uterus/adnexae: 2.2 cm complex cyst right ovary most likely corpus luteal cyst. IMPRESSION: Intrauterine gestational sac corresponding to 6 week 3 day pregnancy. Probable small right ovarian corpus luteal cyst. Probable early intrauterine  gestational sac, but no yolk sac, fetal pole, or cardiac activity yet visualized. Recommend follow-up quantitative B-HCG levels and follow-up US in 14 days to confirm and assess viability. This recommendation follows SRU consensus guidelines: Diagnostic Criteria for Nonviable Pregnancy Early in the First Trimester. Malva Limes Med 2013; 562:1308-65. Electronically Signed   By: Maisie Fus  Register   On: 10/04/2015 08:38    ASSESSMENT 1. Blighted ovum     PLAN Discussed [place] results with pt.  Pt upset, tearful, stepped out of office to call husband.  Pt returned and desired to call husband and talk with him on speaker during discussion.  Discussed US findings with pt and husband. Discussed management options including expectant management with f/u in office, cytotec with follow up in office, and D&C.  Pt husband asked if there was a possibility that pregnancy could continue to grow and desired follow up ultrasound.  Pt desires D&C as soon as possible. Pt tearful and visibly upset in office today.  Offered pt option to go home, continue to discuss with husband, and call back to decide on management plan.  Pt agreed with this plan of care.    Pt undecided about management plan at this time.  Pt to call office tomorrow or before the end of this week to ask any questions and to decide on management plan.    Discharge home in stable condition  Hurshel Party, CNM  10/19/2015  5:04 PM

## 2015-10-26 ENCOUNTER — Ambulatory Visit (HOSPITAL_COMMUNITY)
Admission: AD | Admit: 2015-10-26 | Discharge: 2015-10-27 | Disposition: A | Discharge status: Home / Self Care | Payer: Medicaid Other | Source: Ambulatory Visit | Attending: Family Medicine | Admitting: Family Medicine

## 2015-10-26 ENCOUNTER — Telehealth: Payer: Self-pay | Admitting: Family Medicine

## 2015-10-26 ENCOUNTER — Encounter (HOSPITAL_COMMUNITY)
Admission: AD | Disposition: A | Discharge status: Home / Self Care | Payer: Self-pay | Source: Ambulatory Visit | Attending: Family Medicine

## 2015-10-26 ENCOUNTER — Inpatient Hospital Stay (HOSPITAL_COMMUNITY): Payer: Medicaid Other | Admitting: Certified Registered Nurse Anesthetist

## 2015-10-26 ENCOUNTER — Encounter (HOSPITAL_COMMUNITY): Payer: Self-pay | Admitting: Medical

## 2015-10-26 DIAGNOSIS — R578 Other shock: Secondary | ICD-10-CM | POA: Diagnosis present

## 2015-10-26 DIAGNOSIS — Z880 Allergy status to penicillin: Secondary | ICD-10-CM | POA: Diagnosis not present

## 2015-10-26 DIAGNOSIS — F17210 Nicotine dependence, cigarettes, uncomplicated: Secondary | ICD-10-CM | POA: Diagnosis not present

## 2015-10-26 DIAGNOSIS — Z3A01 Less than 8 weeks gestation of pregnancy: Secondary | ICD-10-CM | POA: Insufficient documentation

## 2015-10-26 DIAGNOSIS — O020 Blighted ovum and nonhydatidiform mole: Secondary | ICD-10-CM | POA: Diagnosis present

## 2015-10-26 DIAGNOSIS — O034 Incomplete spontaneous abortion without complication: Secondary | ICD-10-CM | POA: Diagnosis not present

## 2015-10-26 HISTORY — PX: DILATION AND CURETTAGE OF UTERUS: SHX78

## 2015-10-26 LAB — CBC WITH DIFFERENTIAL/PLATELET
BASOS PCT: 0 %
Basophils Absolute: 0 10*3/uL (ref 0.0–0.1)
EOS ABS: 0.2 10*3/uL (ref 0.0–0.7)
EOS PCT: 2 %
HCT: 25.1 % — ABNORMAL LOW (ref 36.0–46.0)
HEMOGLOBIN: 8.4 g/dL — AB (ref 12.0–15.0)
Lymphocytes Relative: 28 %
Lymphs Abs: 2.6 10*3/uL (ref 0.7–4.0)
MCH: 32.4 pg (ref 26.0–34.0)
MCHC: 33.5 g/dL (ref 30.0–36.0)
MCV: 96.9 fL (ref 78.0–100.0)
Monocytes Absolute: 0.3 10*3/uL (ref 0.1–1.0)
Monocytes Relative: 4 %
NEUTROS PCT: 66 %
Neutro Abs: 6.1 10*3/uL (ref 1.7–7.7)
Platelets: 192 10*3/uL (ref 150–400)
RBC: 2.59 MIL/uL — AB (ref 3.87–5.11)
RDW: 13.4 % (ref 11.5–15.5)
WBC: 9.2 10*3/uL (ref 4.0–10.5)

## 2015-10-26 LAB — TYPE AND SCREEN
ABO/RH(D): A POS
Antibody Screen: NEGATIVE

## 2015-10-26 SURGERY — DILATION AND CURETTAGE
Anesthesia: General | Site: Vagina

## 2015-10-26 MED ORDER — SUCCINYLCHOLINE CHLORIDE 20 MG/ML IJ SOLN
INTRAMUSCULAR | Status: DC | PRN
  Administered 2015-10-26: 100 mg via INTRAVENOUS

## 2015-10-26 MED ORDER — DEXAMETHASONE SODIUM PHOSPHATE 4 MG/ML IJ SOLN
INTRAMUSCULAR | Status: DC | PRN
  Administered 2015-10-26: 4 mg via INTRAVENOUS

## 2015-10-26 MED ORDER — FAMOTIDINE IN NACL 20-0.9 MG/50ML-% IV SOLN
20.0000 mg | Freq: Once | INTRAVENOUS | Status: AC
  Administered 2015-10-26: 20 mg via INTRAVENOUS

## 2015-10-26 MED ORDER — CITRIC ACID-SODIUM CITRATE 334-500 MG/5ML PO SOLN
ORAL | Status: AC
  Filled 2015-10-26: qty 15

## 2015-10-26 MED ORDER — LIDOCAINE HCL (CARDIAC) 20 MG/ML IV SOLN
INTRAVENOUS | Status: DC | PRN
  Administered 2015-10-26: 5 mL via INTRATRACHEAL

## 2015-10-26 MED ORDER — DEXAMETHASONE SODIUM PHOSPHATE 4 MG/ML IJ SOLN
INTRAMUSCULAR | Status: AC
  Filled 2015-10-26: qty 1

## 2015-10-26 MED ORDER — SODIUM CHLORIDE 0.9 % IV SOLN
INTRAVENOUS | Status: DC | PRN
  Administered 2015-10-26: 23:00:00 via INTRAVENOUS

## 2015-10-26 MED ORDER — FAMOTIDINE IN NACL 20-0.9 MG/50ML-% IV SOLN
INTRAVENOUS | Status: AC
  Filled 2015-10-26: qty 50

## 2015-10-26 MED ORDER — OXYTOCIN 10 UNIT/ML IJ SOLN
INTRAMUSCULAR | Status: AC
  Filled 2015-10-26: qty 2

## 2015-10-26 MED ORDER — PROPOFOL 10 MG/ML IV BOLUS
INTRAVENOUS | Status: DC | PRN
  Administered 2015-10-26: 150 mg via INTRAVENOUS

## 2015-10-26 MED ORDER — LIDOCAINE-EPINEPHRINE 1 %-1:100000 IJ SOLN
INTRAMUSCULAR | Status: DC | PRN
  Administered 2015-10-26: 20 mL

## 2015-10-26 MED ORDER — MIDAZOLAM HCL 2 MG/2ML IJ SOLN
INTRAMUSCULAR | Status: AC
  Filled 2015-10-26: qty 2

## 2015-10-26 MED ORDER — LIDOCAINE HCL (CARDIAC) 20 MG/ML IV SOLN
INTRAVENOUS | Status: AC
  Filled 2015-10-26: qty 5

## 2015-10-26 MED ORDER — CITRIC ACID-SODIUM CITRATE 334-500 MG/5ML PO SOLN
30.0000 mL | Freq: Once | ORAL | Status: AC
  Administered 2015-10-26: 30 mL via ORAL

## 2015-10-26 MED ORDER — PROPOFOL 10 MG/ML IV BOLUS
INTRAVENOUS | Status: AC
  Filled 2015-10-26: qty 20

## 2015-10-26 MED ORDER — FENTANYL CITRATE (PF) 100 MCG/2ML IJ SOLN
INTRAMUSCULAR | Status: AC
  Filled 2015-10-26: qty 2

## 2015-10-26 MED ORDER — ONDANSETRON HCL 4 MG/2ML IJ SOLN
INTRAMUSCULAR | Status: AC
  Filled 2015-10-26: qty 2

## 2015-10-26 MED ORDER — ROCURONIUM BROMIDE 100 MG/10ML IV SOLN
INTRAVENOUS | Status: DC | PRN
  Administered 2015-10-26: 10 mg via INTRAVENOUS

## 2015-10-26 MED ORDER — LACTATED RINGERS IV BOLUS (SEPSIS)
1000.0000 mL | Freq: Once | INTRAVENOUS | Status: AC
  Administered 2015-10-26: 1000 mL via INTRAVENOUS
  Administered 2015-10-26 (×2): 500 mL via INTRAVENOUS

## 2015-10-26 MED ORDER — IBUPROFEN 600 MG PO TABS: 600.0000 mg | ORAL_TABLET | Freq: Four times a day (QID) | ORAL | Status: DC | PRN

## 2015-10-26 MED ORDER — FENTANYL CITRATE (PF) 100 MCG/2ML IJ SOLN: 25.0000 ug | INTRAMUSCULAR | Status: DC | PRN

## 2015-10-26 MED ORDER — ONDANSETRON HCL 4 MG/2ML IJ SOLN
4.0000 mg | Freq: Once | INTRAMUSCULAR | Status: AC | PRN
  Administered 2015-10-26: 4 mg via INTRAVENOUS

## 2015-10-26 MED ORDER — DOXYCYCLINE HYCLATE 100 MG IV SOLR
100.0000 mg | Freq: Once | INTRAVENOUS | Status: AC
  Administered 2015-10-26 (×2): 100 mg via INTRAVENOUS
  Filled 2015-10-26: qty 100

## 2015-10-26 SURGICAL SUPPLY — 18 items
CATH ROBINSON RED A/P 16FR (CATHETERS) ×3 IMPLANT
CLOTH BEACON ORANGE TIMEOUT ST (SAFETY) ×3 IMPLANT
CONT SPECI 4OZ STER CLIK (MISCELLANEOUS) ×3 IMPLANT
CONTAINER PREFILL 10% NBF 60ML (FORM) ×6 IMPLANT
DECANTER SPIKE VIAL GLASS SM (MISCELLANEOUS) ×3 IMPLANT
GLOVE BIOGEL PI IND STRL 7.0 (GLOVE) ×2 IMPLANT
GLOVE BIOGEL PI INDICATOR 7.0 (GLOVE) ×4
GLOVE ECLIPSE 7.0 STRL STRAW (GLOVE) ×6 IMPLANT
GOWN STRL REUS W/TWL LRG LVL3 (GOWN DISPOSABLE) ×9 IMPLANT
KIT BERKELEY 1ST TRIMESTER 3/8 (MISCELLANEOUS) ×3 IMPLANT
PACK VAGINAL MINOR WOMEN LF (CUSTOM PROCEDURE TRAY) ×3 IMPLANT
PAD OB MATERNITY 4.3X12.25 (PERSONAL CARE ITEMS) ×3 IMPLANT
PAD PREP 24X48 CUFFED NSTRL (MISCELLANEOUS) ×3 IMPLANT
SET BERKELEY SUCTION TUBING (SUCTIONS) ×3 IMPLANT
TOP DISP BERKELEY (MISCELLANEOUS) ×3 IMPLANT
TOWEL OR 17X24 6PK STRL BLUE (TOWEL DISPOSABLE) ×6 IMPLANT
VACURETTE 10 RIGID CVD (CANNULA) ×3 IMPLANT
WATER STERILE IRR 1000ML POUR (IV SOLUTION) ×3 IMPLANT

## 2015-10-26 NOTE — MAU Provider Note (Signed)
History     CSN: [id number]  Arrival date and time: 10/26/15 2224   First Provider Initiated Contact with Patient 10/26/15 2230      Chief Complaint  Patient presents with  . Vaginal Bleeding   HPI  Ms. Alverda Skeansmanda Maiello is a 25 y.o. G2P0010 at 2542w2d who presents to MAU today be [facility] from [facility] in [place]. She states that she started bleeding yesterday, but it became very heavy while she was at the other hospital. She states pain that comes and goes and is severe at this time. She was given pain medications at the other hospital, but is unsure what kind. She states that pain started last night as well with onset of bleeding, but it is worse now.   OB History    Gravida Para Term Preterm AB TAB SAB Ectopic Multiple Living   2    1  1          History reviewed. No pertinent past medical history.  History reviewed. No pertinent past surgical history.  History reviewed. No pertinent family history.  Social History  Substance Use Topics  . Smoking status: Current Every Day Smoker -- 1.00 packs/day    Types: Cigarettes  . Smokeless tobacco: Never Used  . Alcohol Use: No    Allergies:  Allergies  Allergen Reactions  . Penicillins Anaphylaxis  . Haldol [Haloperidol Decanoate] Other (See Comments)    Seizures    Prescriptions prior to admission  Medication Sig Dispense Refill Last Dose  . Prenatal Vit-Fe Fumarate-FA (PRENATAL VITAMINS PLUS) 27-1 MG TABS Take 1 tablet by mouth daily.   Taking    Review of Systems  Constitutional: Negative for fever and malaise/fatigue.  Gastrointestinal: Positive for abdominal pain.  Genitourinary:       + vaginal bleeding   Physical Exam   Blood pressure 103/60, pulse 87, temperature 98.1 F (36.7 C), resp. rate 18, height 5' (1.524 m), weight 160 lb (72.576 kg), SpO2 100 %, unknown if currently breastfeeding.  Physical Exam  Nursing note and vitals reviewed. Constitutional: She is oriented to person, place, and  time. She appears well-developed and well-nourished. No distress.  HENT:  Head: Normocephalic and atraumatic.  Cardiovascular: Tachycardia present.   Respiratory: Tachypnea noted. No respiratory distress.  GI: Soft. She exhibits no distension and no mass. There is tenderness (moderate tenderness to palaption of the lower abdomen). There is no rebound and no guarding.  Genitourinary:  Patient is bleeding heavily and passing moderate sized clots. Few additional clots evacuated from the vaginal vault  Neurological: She is alert and oriented to person, place, and time.  Skin: Skin is warm and dry. No erythema. There is pallor.  Psychiatric: She has a normal mood and affect.   Results for orders placed or performed during the hospital encounter of 10/26/15 (from the past 24 hour(s))  CBC with Differential/Platelet     Status: Abnormal   Collection Time: 10/26/15 10:36 PM  Result Value Ref Range   WBC 9.2 4.0 - 10.5 K/uL   RBC 2.59 (L) 3.87 - 5.11 MIL/uL   Hemoglobin 8.4 (L) 12.0 - 15.0 g/dL   HCT 03.425.1 (L) 74.236.0 - 59.546.0 %   MCV 96.9 78.0 - 100.0 fL   MCH 32.4 26.0 - 34.0 pg   MCHC 33.5 30.0 - 36.0 g/dL   RDW 63.813.4 75.611.5 - 43.315.5 %   Platelets 192 150 - 400 K/uL   Neutrophils Relative % 66 %   Neutro Abs 6.1 1.7 - 7.7  K/uL   Lymphocytes Relative 28 %   Lymphs Abs 2.6 0.7 - 4.0 K/uL   Monocytes Relative 4 %   Monocytes Absolute 0.3 0.1 - 1.0 K/uL   Eosinophils Relative 2 %   Eosinophils Absolute 0.2 0.0 - 0.7 K/uL   Basophils Relative 0 %   Basophils Absolute 0.0 0.0 - 0.1 K/uL    MAU Course  Procedures None  MDM CBC, ABO/Rh Dr. Shawnie Pons at bedside. Will take patient for D&C.  Blood drawn. O2 in place, 2 IVs in place with bolus fluid.  Consent form signed   Assessment and Plan  A: Blighted ovum Vaginal bleeding   P:  Patient to OR with Dr. Shawnie Pons for D&C due to hypotension and heavy vaginal bleeding  Laymon Stockert N 10/26/2015, 11:01 PM

## 2015-10-26 NOTE — Discharge Instructions (Signed)
Post Anesthesia Home Care Instructions  Activity: Get plenty of rest for the remainder of the day. A responsible adult should stay with you for 24 hours following the procedure.  For the next 24 hours, DO NOT: -Drive a car -Advertising copywriterperate machinery -Drink alcoholic beverages -Take any medication unless instructed by your physician -Make any legal decisions or sign important papers.  Meals: Start with liquid foods such as gelatin or soup. Progress to regular foods as tolerated. Avoid greasy, spicy, heavy foods. If nausea and/or vomiting occur, drink only clear liquids until the nausea and/or vomiting subsides. Call your physician if vomiting continues.  Special Instructions/Symptoms: Your throat may feel dry or sore from the anesthesia or the breathing tube placed in your throat during surgery. If this causes discomfort, gargle with warm salt water. The discomfort should disappear within 24 hours. Dilation and Curettage or Vacuum Curettage, Care After Refer to this sheet in the next few weeks. These instructions provide you with information on caring for yourself after your procedure. Your health care provider may also give you more specific instructions. Your treatment has been planned according to current medical practices, but problems sometimes occur. Call your health care provider if you have any problems or questions after your procedure. WHAT TO EXPECT AFTER THE PROCEDURE After your procedure, it is typical to have light cramping and bleeding. This may last for 2 days to 2 weeks after the procedure. HOME CARE INSTRUCTIONS   Do not drive for 24 hours.  Wait 1 week before returning to strenuous activities.  Take your temperature 2 times a day for 4 days and write it down. Provide these temperatures to your health care provider if you develop a fever.  Avoid long periods of standing.  Avoid heavy lifting, pushing, or pulling. Do not lift anything heavier than 10 pounds (4.5 kg).  Limit  stair climbing to once or twice a day.  Take rest periods often.  You may resume your usual diet.  Drink enough fluids to keep your urine clear or pale yellow.  Your usual bowel function should return. If you have constipation, you may:  Take a mild laxative with permission from your health care provider.  Add fruit and bran to your diet.  Drink more fluids.  Take showers instead of baths until your health care provider gives you permission to take baths.  Do not go swimming or use a hot tub until your health care provider approves.  Try to have someone with you or available to you the first 24-48 hours, especially if you were given a general anesthetic.  Do not douche, use tampons, or have sex (intercourse) for 2 weeks after the procedure.  Only take over-the-counter or prescription medicines as directed by your health care provider. Do not take aspirin. It can cause bleeding.  Follow up with your health care provider as directed. SEEK MEDICAL CARE IF:   You have increasing cramps or pain that is not relieved with medicine.  You have abdominal pain that does not seem to be related to the same area of earlier cramping and pain.  You have bad smelling vaginal discharge.  You have a rash.  You are having problems with any medicine. SEEK IMMEDIATE MEDICAL CARE IF:   You have bleeding that is heavier than a normal menstrual period.  You have a fever.  You have chest pain.  You have shortness of breath.  You feel dizzy or feel like fainting.  You pass out.  You have pain  in your shoulder strap area.  You have heavy vaginal bleeding with or without blood clots. MAKE SURE YOU:   Understand these instructions.  Will watch your condition.  Will get help right away if you are not doing well or get worse.   This information is not intended to replace advice given to you by your health care provider. Make sure you discuss any questions you have with your health care  provider.   Document Released: 07/14/2000 Document Revised: 07/22/2013 Document Reviewed: 02/13/2013 Elsevier Interactive Patient Education [employer]

## 2015-10-26 NOTE — Op Note (Signed)
Kristy SkeansAmanda Gomez  PROCEDURE DATE: 10/26/2015  PREOPERATIVE DIAGNOSIS: 7 week incomplete abortion, hemorrhagic shock  POSTOPERATIVE DIAGNOSIS: The same.  PROCEDURE:    Suction Dilation and Evacuation.  SURGEON:  Lollie Gunner S  ANESTHESIA: Cecile HearingStephen Edward Turk, MD  INDICATIONS: 25 y.o. G2P0010with Incomplete AB at [redacted] weeks gestation, needing surgical completion.  Risks of surgery were discussed with the patient including but not limited to: bleeding which may require transfusion; infection which may require antibiotics; injury to uterus or surrounding organs;need for additional procedures including laparotomy or laparoscopy; possibility of intrauterine scarring which may impair future fertility; and other postoperative/anesthesia complications. Written informed consent was obtained.    FINDINGS:  A 8 wk size midline uterus, moderate amounts of products of conception, specimen sent to pathology.  ANESTHESIA:    GETT - Cecile HearingStephen Edward Turk, MD, paracervical block.  ESTIMATED BLOOD LOSS:  Less than 20 ml.  SPECIMENS:  Products of conception sent to pathology  COMPLICATIONS:  None immediate.  PROCEDURE DETAILS:  The patient received intravenous antibiotics while in the preoperative area.  She was then taken to the operating room where general anesthesia was administered and was found to be adequate.  After an adequate timeout was performed, she was placed in the dorsal lithotomy position and examined; then prepped and draped in the sterile manner.   Her bladder was catheterized for an unmeasured amount of clear, yellow urine. A vaginal speculum was then placed in the patient's vagina and a single tooth tenaculum was applied to the anterior lip of the cervix. Cervix was visually dilated and POC noted at cervical os and removed with a ring forcep. A paracervical block using 1% Lidocaine with Epinephrine was administered. The cervix was gently dilated to accommodate a 9 mm suction curette that was gently  advanced to the uterine fundus.  The suction device was then activated and curette slowly rotated to clear the uterus of products of conception.  A sharp curettage was then performed to confirm complete emptying of the uterus.There was minimal bleeding noted and the tenaculum removed with good hemostasis noted.  All insturmnent, needle and lap counts were correct x 2.The patient tolerated the procedure well.  The patient was taken to the recovery area in stable condition.  Vi Whitesel S 10/26/2015 11:46 PM

## 2015-10-26 NOTE — Telephone Encounter (Signed)
Questions regarding when she was seen last week

## 2015-10-26 NOTE — Anesthesia Preprocedure Evaluation (Addendum)
Anesthesia Evaluation  Patient identified by MRN, date of birth, ID band Patient awake    Reviewed: Allergy & Precautions, NPO status , Patient's Chart, lab work & pertinent test results  Airway Mallampati: II  TM Distance: >3 FB Neck ROM: Full    Dental  (+) Teeth Intact, Dental Advisory Given   Pulmonary Current Smoker,    Pulmonary exam normal breath sounds clear to auscultation       Cardiovascular Exercise Tolerance: Good negative cardio ROS Normal cardiovascular exam Rhythm:Regular Rate:Normal     Neuro/Psych negative neurological ROS  negative psych ROS   GI/Hepatic negative GI ROS, Patient denies substance abuse, but OBGYN reports prior positive drug screen   Endo/Other  negative endocrine ROS  Renal/GU negative Renal ROS     Musculoskeletal negative musculoskeletal ROS (+)   Abdominal   Peds  Hematology  (+) Blood dyscrasia, anemia ,   Anesthesia Other Findings Day of surgery medications reviewed with the patient.  Ate 3 hours ago.  Reproductive/Obstetrics Missed abortion                            Anesthesia Physical Anesthesia Plan  ASA: II and emergent  Anesthesia Plan: General   Post-op Pain Management:    Induction: Intravenous, Rapid sequence and Cricoid pressure planned  Airway Management Planned: Oral ETT  Additional Equipment:   Intra-op Plan:   Post-operative Plan: Extubation in OR  Informed Consent: I have reviewed the patients History and Physical, chart, labs and discussed the procedure including the risks, benefits and alternatives for the proposed anesthesia with the patient or authorized representative who has indicated his/her understanding and acceptance.   Dental advisory given  Plan Discussed with: CRNA  Anesthesia Plan Comments: (Risks/benefits of general anesthesia discussed with patient including risk of damage to teeth, lips, gum, and  tongue, nausea/vomiting, allergic reactions to medications, and the possibility of heart attack, stroke and death.  All patient questions answered.  Patient wishes to proceed.)        Anesthesia Quick Evaluation

## 2015-10-26 NOTE — H&P (Signed)
  Kristy Gomez is an 25 y.o. 282P0010 female.   Chief Complaint: bleeding, abdominal pain HPI: Known blighted ovum. No treatment. Began spotting yesterday. Developed heavy vaginal bleeding in last 4 hours. Now with low BP, dizziness, abdominal cramping and bleeding. [facility] at [facility] to [facility].  No past medical history on file.  No past surgical history on file.  No family history on file. Social History:  reports that she has been smoking Cigarettes.  She has been smoking about 1.00 pack per day. She has never used smokeless tobacco. She reports that she does not drink alcohol or use illicit drugs.  Allergies:  Allergies  Allergen Reactions  . Penicillins Anaphylaxis  . Haldol [Haloperidol Decanoate] Other (See Comments)    Seizures    No current facility-administered medications on file prior to encounter.   Current Outpatient Prescriptions on File Prior to Encounter  Medication Sig Dispense Refill  . Prenatal Vit-Fe Fumarate-FA (PRENATAL VITAMINS PLUS) 27-1 MG TABS Take 1 tablet by mouth daily.      A comprehensive review of systems was negative.  Blood pressure 80/39, pulse 100, temperature 98.1 F (36.7 C), resp. rate 22, height 5' (1.524 m), weight 160 lb (72.576 kg), SpO2 100 %, unknown if currently breastfeeding. General appearance: alert, moderate distress, mildly obese and pale Head: Normocephalic, without obvious abnormality, atraumatic Neck: supple, symmetrical, trachea midline Lungs: rapid breathing Heart: regular rate and rhythm Abdomen: soft, non-tender; bowel sounds normal; no masses,  no organomegaly Pelvic: external genitalia normal and large amount of blood noted with clot, could not tolerate further exam Extremities: extremities normal, atraumatic, no cyanosis or edema Skin: Skin color, texture, turgor normal. No rashes or lesions Neurologic: Grossly normal   Lab Results  Component Value Date   WBC 9.2 10/26/2015   HGB 8.4* 10/26/2015    HCT 25.1* 10/26/2015   MCV 96.9 10/26/2015   PLT 192 10/26/2015   Lab Results  Component Value Date   PREGTESTUR POSITIVE* 09/22/2015    Assessment/Plan Principal Problem:   Incomplete spontaneous abortion Active Problems:   Hemorrhagic shock  For Suction D and C due to significant (4 gram) Hgb drop. Risks include but are not limited to bleeding, infection, injury to surrounding structures, including bowel, bladder and ureters, blood clots, and death.  Likelihood of success is high.  Ardis Lawley S 10/26/2015, 10:50 PM

## 2015-10-26 NOTE — Anesthesia Procedure Notes (Signed)
Procedure Name: Intubation Date/Time: 10/26/2015 11:23 PM Performed by: Yolonda KidaARVER, Bobbette Eakes L Pre-anesthesia Checklist: Patient identified, Emergency Drugs available, Suction available, Patient being monitored and Timeout performed Patient Re-evaluated:Patient Re-evaluated prior to inductionOxygen Delivery Method: Circle system utilized Preoxygenation: Pre-oxygenation with 100% oxygen Intubation Type: IV induction, Rapid sequence and Cricoid Pressure applied Laryngoscope Size: 3 Grade View: Grade II Tube size: 7.0 mm Number of attempts: 1 Airway Equipment and Method: Stylet Placement Confirmation: ETT inserted through vocal cords under direct vision,  positive ETCO2 and breath sounds checked- equal and bilateral Secured at: 21 cm Tube secured with: Tape Dental Injury: Teeth and Oropharynx as per pre-operative assessment

## 2015-10-27 ENCOUNTER — Encounter (HOSPITAL_COMMUNITY): Payer: Self-pay | Admitting: Family Medicine

## 2015-10-27 DIAGNOSIS — O034 Incomplete spontaneous abortion without complication: Secondary | ICD-10-CM | POA: Diagnosis not present

## 2015-10-27 LAB — RAPID URINE DRUG SCREEN, HOSP PERFORMED
AMPHETAMINES: NOT DETECTED
Barbiturates: NOT DETECTED
Benzodiazepines: NOT DETECTED
Cocaine: POSITIVE — AB
OPIATES: POSITIVE — AB
TETRAHYDROCANNABINOL: NOT DETECTED

## 2015-10-27 LAB — ABO/RH: ABO/RH(D): A POS

## 2015-10-27 MED ORDER — NEOSTIGMINE METHYLSULFATE 10 MG/10ML IV SOLN
INTRAVENOUS | Status: DC | PRN
  Administered 2015-10-26: 2 mg via INTRAVENOUS

## 2015-10-27 MED ORDER — GLYCOPYRROLATE 0.2 MG/ML IJ SOLN
INTRAMUSCULAR | Status: DC | PRN
  Administered 2015-10-26: 0.4 mg via INTRAVENOUS

## 2015-10-27 MED ORDER — SUCCINYLCHOLINE CHLORIDE 20 MG/ML IJ SOLN
INTRAMUSCULAR | Status: AC
  Filled 2015-10-27: qty 1

## 2015-10-27 NOTE — Anesthesia Postprocedure Evaluation (Signed)
Anesthesia Post Note  Patient: Kristy Gomez  Procedure(s) Performed: Procedure(s) (LRB): DILATATION AND CURETTAGE, Suction (N/A)  Patient location during evaluation: PACU Anesthesia Type: General Level of consciousness: awake and alert Pain management: pain level controlled Vital Signs Assessment: post-procedure vital signs reviewed and stable Respiratory status: spontaneous breathing, nonlabored ventilation, respiratory function stable and patient connected to nasal cannula oxygen Cardiovascular status: blood pressure returned to baseline and stable Postop Assessment: no signs of nausea or vomiting Anesthetic complications: no    Last Vitals:  Filed Vitals:   10/27/15 0001 10/27/15 0015  BP: 123/58 106/56  Pulse: 96 90  Temp: 36.8 C   Resp: 16     Last Pain:  Filed Vitals:   10/27/15 0029  PainSc: 5                  Cecile HearingStephen Edward Ski Polich

## 2015-10-27 NOTE — Transfer of Care (Signed)
Immediate Anesthesia Transfer of Care Note  Patient: Kristy Gomez  Procedure(s) Performed: Procedure(s): DILATATION AND CURETTAGE (N/A)  Patient Location: PACU  Anesthesia Type:General  Level of Consciousness: awake, alert  and oriented  Airway & Oxygen Therapy: Patient Spontanous Breathing and Patient connected to nasal cannula oxygen  Post-op Assessment: Report given to RN and Post -op Vital signs reviewed and stable  Post vital signs: Reviewed and stable  Last Vitals:  Filed Vitals:   10/26/15 2252 10/27/15 0001  BP: 103/60   Pulse: 87 96  Temp:  36.8 C  Resp: 18     Complications: No apparent anesthesia complications

## 2015-10-27 NOTE — Telephone Encounter (Signed)
Called Marchelle Folksmanda and left a message I was returning her call- please call clinic if you still have questions.

## 2015-11-01 NOTE — Telephone Encounter (Signed)
Patient called and left message requesting call back at [phone]. Called patient and a man answered stating she was sick in the bed right now and couldn't come to the phone. Told him to let the patient know we were returning her phone call. He stated he would

## 2015-11-04 ENCOUNTER — Encounter: Payer: Medicaid Other | Admitting: Obstetrics & Gynecology

## 2015-11-10 ENCOUNTER — Ambulatory Visit: Payer: Medicaid Other | Admitting: Obstetrics & Gynecology

## 2017-09-11 ENCOUNTER — Encounter (HOSPITAL_COMMUNITY): Payer: Self-pay | Admitting: *Deleted

## 2017-09-11 ENCOUNTER — Inpatient Hospital Stay (HOSPITAL_COMMUNITY)
Admission: AD | Admit: 2017-09-11 | Discharge: 2017-09-11 | Disposition: A | Discharge status: Home / Self Care | Payer: Medicaid Other | Source: Ambulatory Visit | Attending: Obstetrics and Gynecology | Admitting: Obstetrics and Gynecology

## 2017-09-11 DIAGNOSIS — R109 Unspecified abdominal pain: Secondary | ICD-10-CM

## 2017-09-11 DIAGNOSIS — G8929 Other chronic pain: Secondary | ICD-10-CM

## 2017-09-11 DIAGNOSIS — F17210 Nicotine dependence, cigarettes, uncomplicated: Secondary | ICD-10-CM | POA: Insufficient documentation

## 2017-09-11 DIAGNOSIS — Z9889 Other specified postprocedural states: Secondary | ICD-10-CM | POA: Insufficient documentation

## 2017-09-11 DIAGNOSIS — M545 Low back pain: Secondary | ICD-10-CM | POA: Diagnosis not present

## 2017-09-11 LAB — URINALYSIS, ROUTINE W REFLEX MICROSCOPIC
Bacteria, UA: NONE SEEN
Bilirubin Urine: NEGATIVE
Glucose, UA: NEGATIVE mg/dL
Ketones, ur: NEGATIVE mg/dL
Nitrite: NEGATIVE
Protein, ur: NEGATIVE mg/dL
Specific Gravity, Urine: 1.011 (ref 1.005–1.030)
pH: 6 (ref 5.0–8.0)

## 2017-09-11 NOTE — MAU Provider Note (Signed)
History     CSN: [id number]  Arrival date and time: 09/11/17 4098   First Provider Initiated Contact with Patient 09/11/17 1052      Chief Complaint  Patient presents with  . Back Pain   HPI Kristy Gomez is a 27 y.o. G37P1011 female who presents with flank pain. She is 3 weeks s/p SVD. She delivered at [facility] & goes to [facility] in [place] for ob & PCP.  Reports left flank pain that has been off & on for the last 5 years. Has never been evaluated. Discussed with her ob/gyn & they said they would address it after her pregnancy. She called the office today & was told they couldn't see her before her PP appt on 2/22 but did mention possible referral for evaluation. Reports left flank pain that she describes as throbbing and currently rates 3/10. Has not treated pain. Denies fever/chills, n/v, dysuria, hematuria. No change in symptoms today; states she is here b/c her spouse wanted her to be checked out.    History reviewed. No pertinent past medical history.  Past Surgical History:  Procedure Laterality Date  . DILATION AND CURETTAGE OF UTERUS N/A 10/26/2015   Procedure: DILATATION AND CURETTAGE, Suction;  Surgeon: Reva Bores, MD;  Location: [facility];  Service: Gynecology;  Laterality: N/A;  . OVARIAN CYST REMOVAL      History reviewed. No pertinent family history.  Social History   Tobacco Use  . Smoking status: Current Every Day Smoker    Packs/day: 1.00    Types: Cigarettes  . Smokeless tobacco: Never Used  Substance Use Topics  . Alcohol use: No  . Drug use: No    Allergies:  Allergies  Allergen Reactions  . Penicillins Anaphylaxis    Has patient had a PCN reaction causing immediate rash, facial/tongue/throat swelling, SOB or lightheadedness with hypotension: Yes Has patient had a PCN reaction causing severe rash involving mucus membranes or skin necrosis: Yes Has patient had a PCN reaction that required hospitalization: Yes Has patient had a PCN  reaction occurring within the last 10 years: Yes If all of the above answers are "NO", then may proceed with Cephalosporin use.  . Haldol [Haloperidol Decanoate] Other (See Comments)    Seizures    Medications Prior to Admission  Medication Sig Dispense Refill Last Dose  . ARIPiprazole (ABILIFY) 20 MG tablet Take 20 mg by mouth daily.   09/10/2017 at Unknown time  . DULoxetine (CYMBALTA) 60 MG capsule Take 60 mg by mouth daily.   09/10/2017 at Unknown time  . gabapentin (NEURONTIN) 300 MG capsule Take 300 mg by mouth 3 (three) times daily.   09/10/2017 at Unknown time  . Prenatal Vit-Fe Fumarate-FA (PRENATAL VITAMINS PLUS) 27-1 MG TABS Take 1 tablet by mouth daily.   09/11/2017 at Unknown time    Review of Systems  Constitutional: Negative.   Gastrointestinal: Negative.   Genitourinary: Positive for flank pain. Negative for dysuria, frequency, hematuria and vaginal discharge.  Musculoskeletal: Negative for back pain.   Physical Exam   Blood pressure 110/68, pulse 98, temperature 99.2 F (37.3 C), temperature source Oral, resp. rate 16, height 5' (1.524 m), weight 171 lb (77.6 kg), SpO2 99 %, unknown if currently breastfeeding.  Physical Exam  Nursing note and vitals reviewed. Constitutional: She is oriented to person, place, and time. She appears well-developed and well-nourished. No distress.  HENT:  Head: Normocephalic and atraumatic.  Eyes: Conjunctivae are normal. Right eye exhibits no discharge. Left eye  exhibits no discharge. No scleral icterus.  Neck: Normal range of motion.  Cardiovascular: Normal rate, regular rhythm and normal heart sounds.  No murmur heard. Respiratory: Effort normal and breath sounds normal. No respiratory distress. She has no wheezes.  GI: Soft. There is no CVA tenderness.  Musculoskeletal:       Lumbar back: Normal.  Neurological: She is alert and oriented to person, place, and time.  Skin: Skin is warm and dry. She is not diaphoretic.  Psychiatric:  She has a normal mood and affect. Her behavior is normal. Judgment and thought content normal.    MAU Course  Procedures Results for orders placed or performed during the hospital encounter of 09/11/17 (from the past 24 hour(s))  Urinalysis, Routine w reflex microscopic     Status: Abnormal   Collection Time: 09/11/17 10:03 AM  Result Value Ref Range   Color, Urine YELLOW YELLOW   APPearance CLEAR CLEAR   Specific Gravity, Urine 1.011 1.005 - 1.030   pH 6.0 5.0 - 8.0   Glucose, UA NEGATIVE NEGATIVE mg/dL   Hgb urine dipstick MODERATE (A) NEGATIVE   Bilirubin Urine NEGATIVE NEGATIVE   Ketones, ur NEGATIVE NEGATIVE mg/dL   Protein, ur NEGATIVE NEGATIVE mg/dL   Nitrite NEGATIVE NEGATIVE   Leukocytes, UA SMALL (A) NEGATIVE   RBC / HPF 0-5 0 - 5 RBC/hpf   WBC, UA 0-5 0 - 5 WBC/hpf   Bacteria, UA NONE SEEN NONE SEEN   Squamous Epithelial / LPF 0-5 (A) NONE SEEN   Mucus PRESENT     MDM VSS, NAD Benign exam & no CVAT No acute processes apparent at this time. Discussed with patient that the best course of action would be to accept the referral to urology as recommended by her PCP. She is agreeable with this plan.   Assessment and Plan  A: 1. Flank pain, chronic    P: Discharge home Contact PCP for urology referral or further workup Discussed reasons to return to MAU vs ED  Judeth HornErin Mearle Drew 09/11/2017, 10:52 AM

## 2017-09-11 NOTE — MAU Note (Signed)
Pt reports she has had left kidney pain off/on for years, states she mentioned it when she was pregnant (delivered 01/22) and was told they would follow up after delivery. Pt states she called today and because appointments were booked so far out they told her to come here.

## 2017-09-11 NOTE — Discharge Instructions (Signed)
Flank Pain, Adult Flank pain is pain that is located on the side of the body between the upper abdomen and the back. This area is called the flank. The pain may occur over a short period of time (acute), or it may be long-term or recurring (chronic). It may be mild or severe. Flank pain can be caused by many things, including:  Muscle soreness or injury.  Kidney stones or kidney disease.  Stress.  A disease of the spine (vertebral disk disease).  A lung infection (pneumonia).  Fluid around the lungs (pulmonary edema).  A skin rash caused by the chickenpox virus (shingles).  Tumors that affect the back of the abdomen.  Gallbladder disease.  Follow these instructions at home:  Drink enough fluid to keep your urine clear or pale yellow.  Rest as told by your health care provider.  Take over-the-counter and prescription medicines only as told by your health care provider.  Keep a journal to track what has caused your flank pain and what has made it feel better.  Keep all follow-up visits as told by your health care provider. This is important. Contact a health care provider if:  Your pain is not controlled with medicine.  You have new symptoms.  Your pain gets worse.  You have a fever.  Your symptoms last longer than 2-3 days.  You have trouble urinating or you are urinating very frequently. Get help right away if:  You have trouble breathing or you are short of breath.  Your abdomen hurts or it is swollen or red.  You have nausea or vomiting.  You feel faint or you pass out.  You have blood in your urine. Summary  Flank pain is pain that is located on the side of the body between the upper abdomen and the back.  The pain may occur over a short period of time (acute), or it may be long-term or recurring (chronic). It may be mild or severe.  Flank pain can be caused by many things.  Contact your health care provider if your symptoms get worse or they last  longer than 2-3 days. This information is not intended to replace advice given to you by your health care provider. Make sure you discuss any questions you have with your health care provider. Document Released: 09/07/2005 Document Revised: 09/29/2016 Document Reviewed: 09/29/2016 Elsevier Interactive Patient Education  2018 [employer]  

## 2017-09-28 IMAGING — US US OB TRANSVAGINAL
1 series · 15 of 28 positions shown · non-contrast
Comparison: No recent prior.

CLINICAL DATA: Positive pregnancy test.

EXAM:
OBSTETRIC <14 WK US AND TRANSVAGINAL OB US
TECHNIQUE: Both transabdominal and transvaginal ultrasound examinations were
performed for complete evaluation of the gestation as well as the
maternal uterus, adnexal regions, and pelvic cul-de-sac.
Transvaginal technique was performed to assess early pregnancy.

[Series 1: us ob transvaginal · 31 acquisitions, 15 frames shown]
[im 1/31]
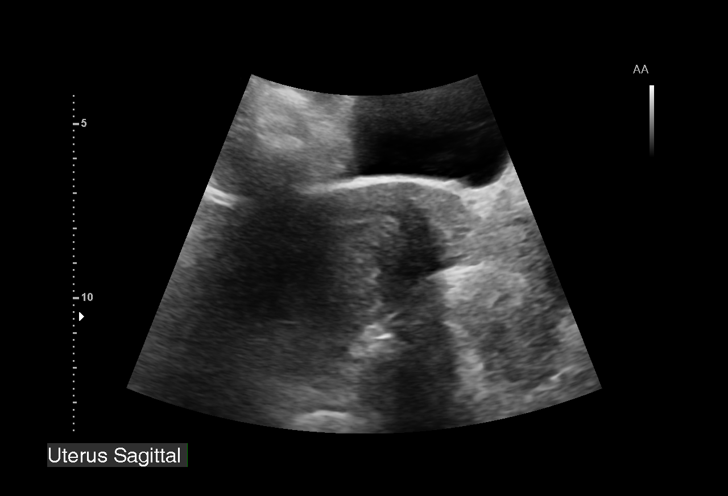
[im 3/31]
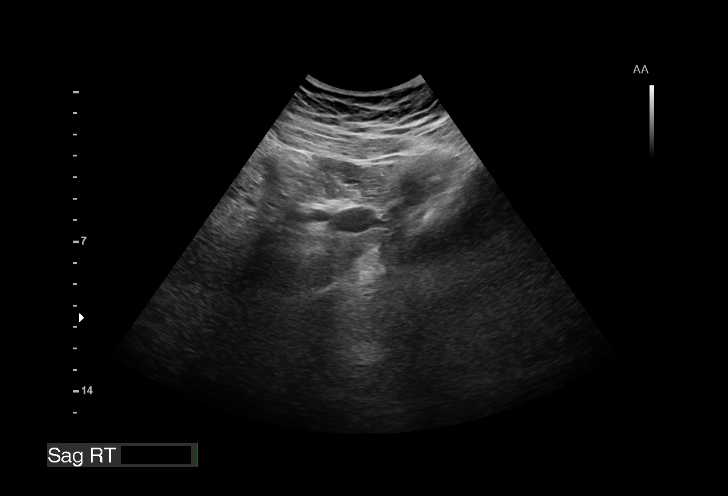
[im 5/31]
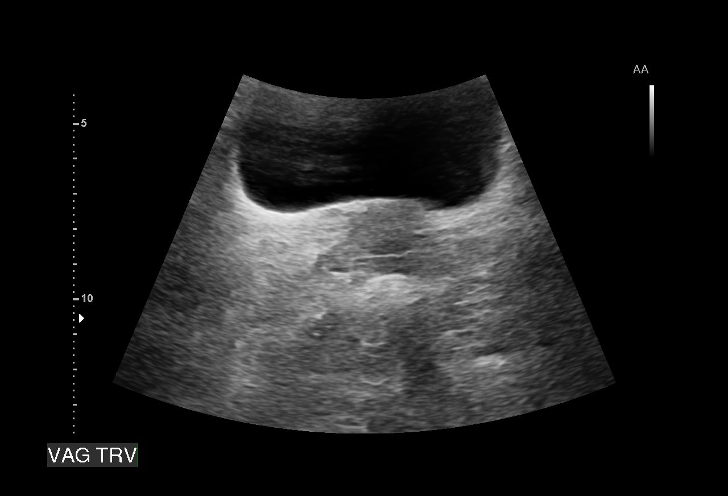
[im 7/31]
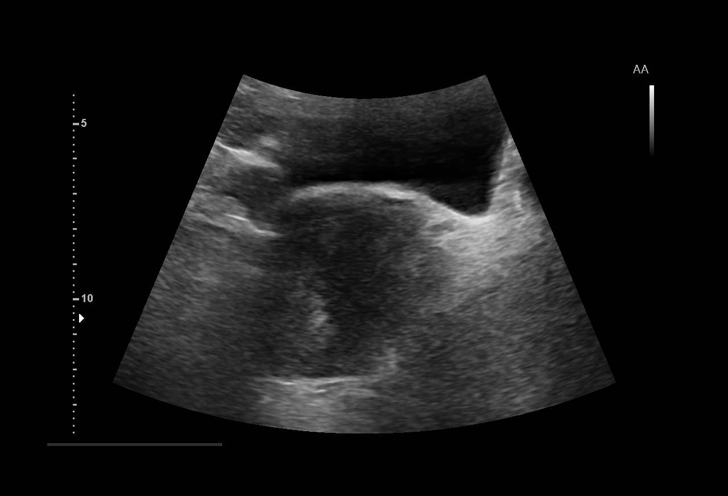
[im 9/31]
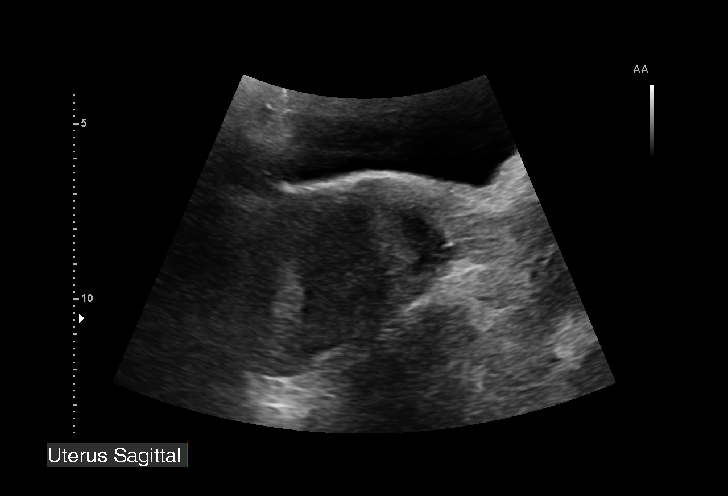
[im 12/31]
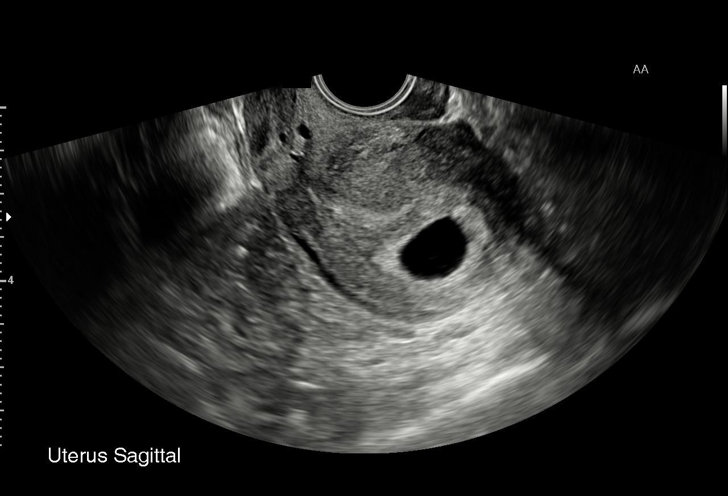
[im 14/31]
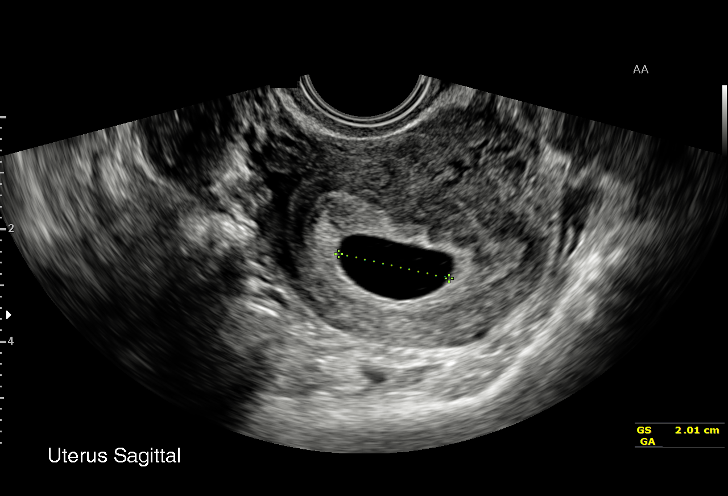
[im 16/31]
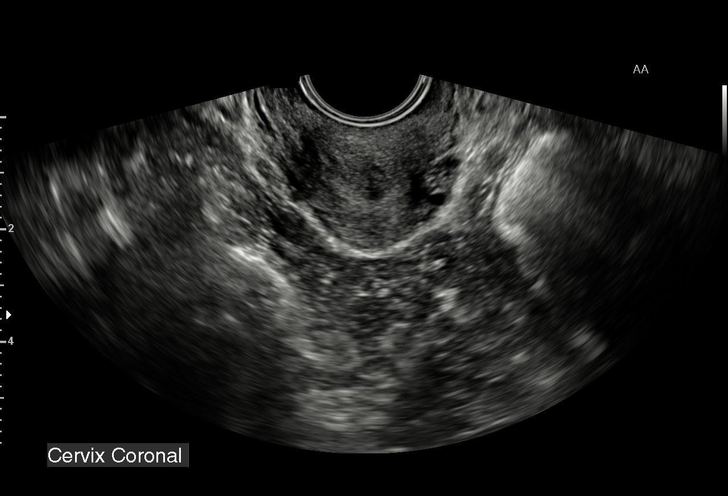
[im 17/31]
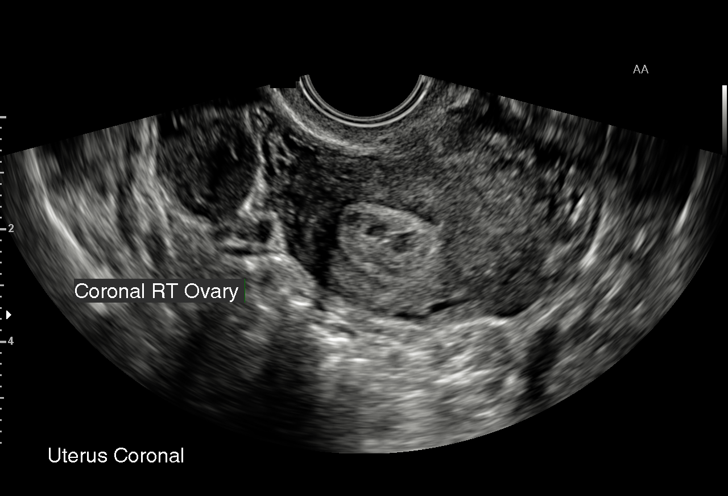
[im 19/31]
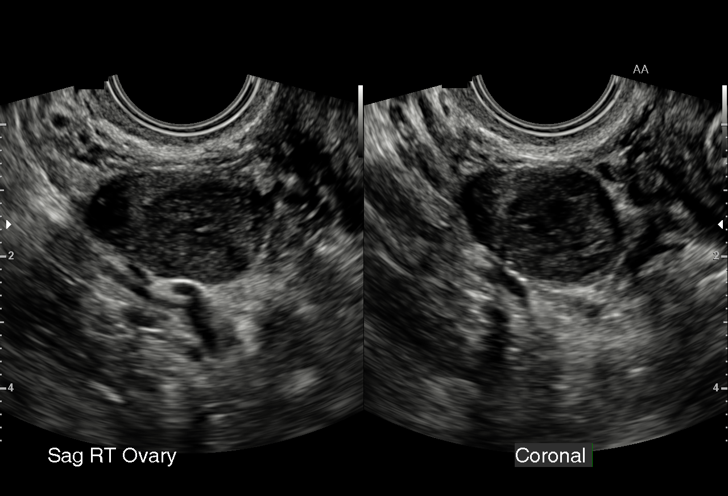
[im 22/31]
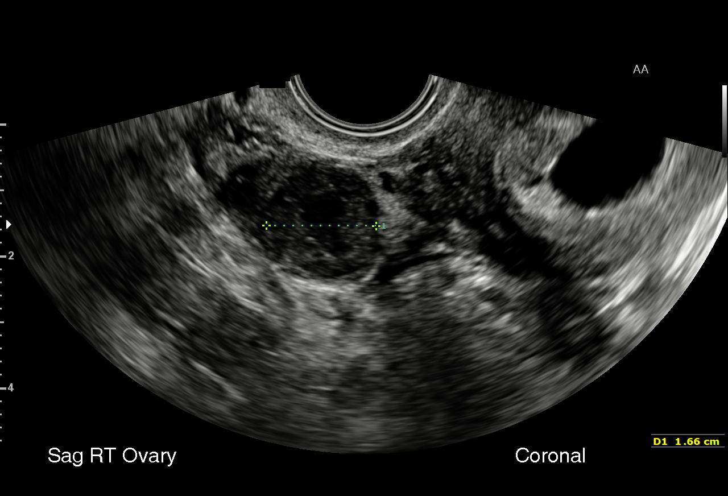
[im 24/31]
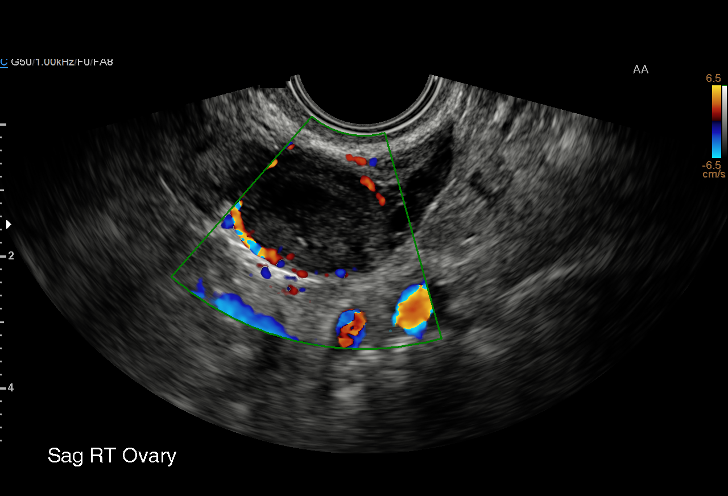
[im 26/31]
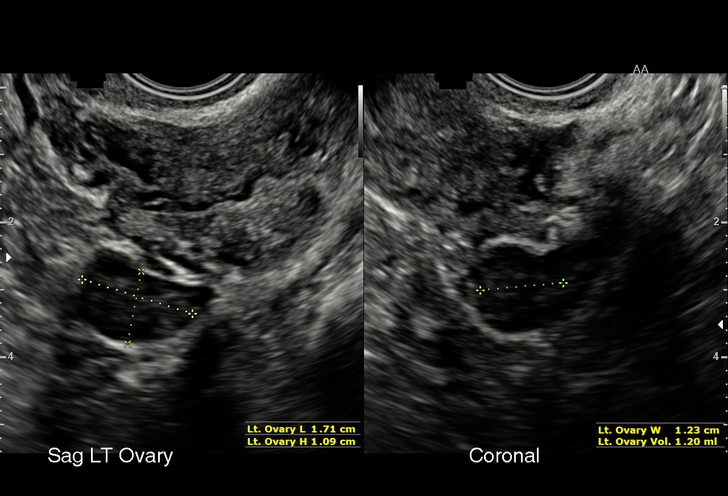
[im 28/31]
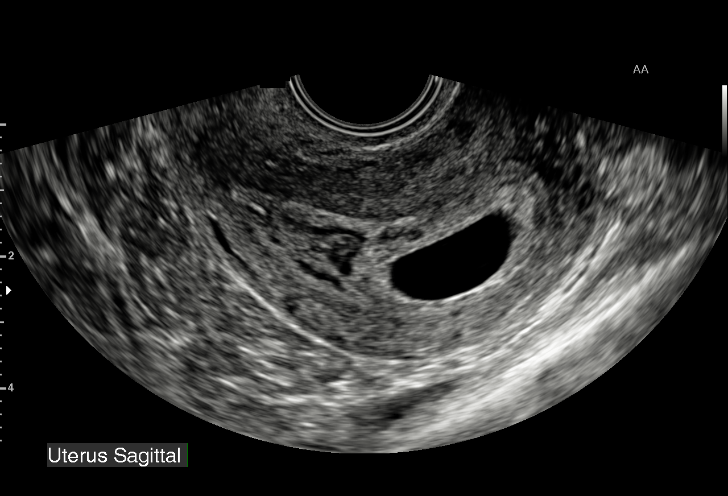
[im 31/31]
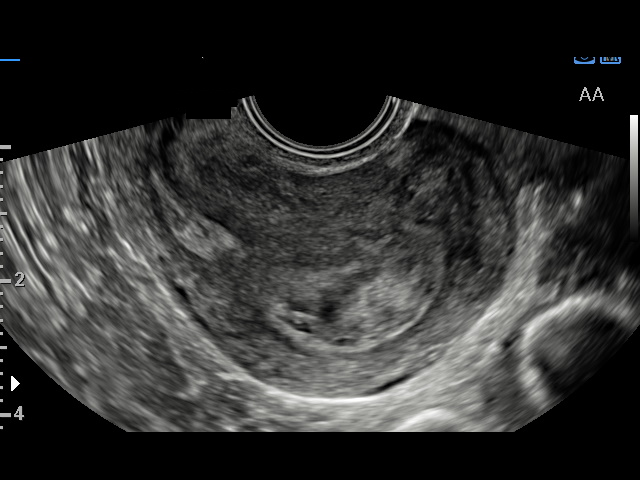

[15 of 28 positions shown; findings below may reference images not displayed]

FINDINGS: Intrauterine gestational sac: Visualized/normal in shape.

Yolk sac:  Not visualized.

Embryo:  Not visualized.

Cardiac Activity: Not visualized.

MSD: 1.6 cm 6 w   3  d

Subchorionic hemorrhage:  None visualized.

Maternal uterus/adnexae: 2.2 cm complex cyst right ovary most likely
corpus luteal cyst.
IMPRESSION: Intrauterine gestational sac corresponding to 6 week 3 day
pregnancy. Probable small right ovarian corpus luteal cyst. Probable
early intrauterine gestational sac, but no yolk sac, fetal pole, or
cardiac activity yet visualized. Recommend follow-up quantitative
B-HCG levels and follow-up US in 14 days to confirm and assess
viability. This recommendation follows SRU consensus guidelines:
Diagnostic Criteria for Nonviable Pregnancy Early in the First
Trimester. N Engl J Med 2586; [DATE].

## 2018-10-21 ENCOUNTER — Encounter (HOSPITAL_COMMUNITY): Payer: Self-pay | Admitting: *Deleted

## 2018-10-21 ENCOUNTER — Emergency Department (HOSPITAL_COMMUNITY)
Admission: EM | Admit: 2018-10-21 | Discharge: 2018-10-21 | Discharge status: Against Medical Advice | Payer: Medicaid Other | Attending: Emergency Medicine | Admitting: Emergency Medicine

## 2018-10-21 ENCOUNTER — Other Ambulatory Visit: Payer: Self-pay

## 2018-10-21 DIAGNOSIS — G8929 Other chronic pain: Secondary | ICD-10-CM | POA: Diagnosis not present

## 2018-10-21 DIAGNOSIS — M25552 Pain in left hip: Secondary | ICD-10-CM | POA: Diagnosis not present

## 2018-10-21 DIAGNOSIS — W19XXXA Unspecified fall, initial encounter: Secondary | ICD-10-CM | POA: Insufficient documentation

## 2018-10-21 DIAGNOSIS — Z880 Allergy status to penicillin: Secondary | ICD-10-CM | POA: Insufficient documentation

## 2018-10-21 DIAGNOSIS — Y999 Unspecified external cause status: Secondary | ICD-10-CM | POA: Insufficient documentation

## 2018-10-21 DIAGNOSIS — F17210 Nicotine dependence, cigarettes, uncomplicated: Secondary | ICD-10-CM | POA: Diagnosis not present

## 2018-10-21 DIAGNOSIS — Z79899 Other long term (current) drug therapy: Secondary | ICD-10-CM | POA: Diagnosis not present

## 2018-10-21 DIAGNOSIS — S8992XA Unspecified injury of left lower leg, initial encounter: Secondary | ICD-10-CM

## 2018-10-21 DIAGNOSIS — M545 Low back pain: Secondary | ICD-10-CM | POA: Diagnosis not present

## 2018-10-21 DIAGNOSIS — S79922A Unspecified injury of left thigh, initial encounter: Secondary | ICD-10-CM | POA: Insufficient documentation

## 2018-10-21 DIAGNOSIS — Y929 Unspecified place or not applicable: Secondary | ICD-10-CM | POA: Insufficient documentation

## 2018-10-21 DIAGNOSIS — Y9301 Activity, walking, marching and hiking: Secondary | ICD-10-CM | POA: Diagnosis not present

## 2018-10-21 MED ORDER — KETOROLAC TROMETHAMINE 60 MG/2ML IM SOLN: 30.0000 mg | Freq: Once | INTRAMUSCULAR | Status: DC

## 2018-10-21 NOTE — ED Notes (Signed)
Pt left AMA °

## 2018-10-21 NOTE — ED Provider Notes (Signed)
[facility]-EMERGENCY DEPT Provider Note   CSN: [id number] Arrival date & time: 10/21/18  1924    History   Chief Complaint Chief Complaint  Patient presents with  . Hip Pain    HPI Kristy Gomez is a 28 y.o. female.     HPI  She presents for evaluation of an injury to her left hip and left upper leg.  This occurred tonight when her "tall girl boot heel fell off."  She was able to ambulate afterwards and presents complaining of new pain only in her left hip and left upper leg.  She has chronic ongoing low back and bilateral hip pain.  She was recently treated by her PCP with injection of Toradol, for pain.  She sees an orthopedist and gets lumbar injections, periodically.  She denies dizziness, nausea, vomiting, weakness or paresthesia.  There are no other known modifying factors.    History reviewed. No pertinent past medical history.  Patient Active Problem List   Diagnosis Date Noted  . Hemorrhagic shock (HCC) 10/26/2015  . Incomplete spontaneous abortion 10/26/2015    Past Surgical History:  Procedure Laterality Date  . DILATION AND CURETTAGE OF UTERUS N/A 10/26/2015   Procedure: DILATATION AND CURETTAGE, Suction;  Surgeon: Reva Bores, MD;  Location: [facility];  Service: Gynecology;  Laterality: N/A;  . OVARIAN CYST REMOVAL       OB History    Gravida  2   Para  1   Term  1   Preterm      AB  1   Living  1     SAB  1   TAB      Ectopic      Multiple      Live Births               Home Medications    Prior to Admission medications   Medication Sig Start Date End Date Taking? Authorizing Provider  ARIPiprazole (ABILIFY) 20 MG tablet Take 20 mg by mouth daily.    [provider]  DULoxetine (CYMBALTA) 60 MG capsule Take 60 mg by mouth daily.    [provider]  gabapentin (NEURONTIN) 300 MG capsule Take 300 mg by mouth 3 (three) times daily.    [provider]  Prenatal Vit-Fe Fumarate-FA  (PRENATAL VITAMINS PLUS) 27-1 MG TABS Take 1 tablet by mouth daily.    [provider]    Family History No family history on file.  Social History Social History   Tobacco Use  . Smoking status: Current Every Day Smoker    Packs/day: 1.00    Types: Cigarettes  . Smokeless tobacco: Never Used  Substance Use Topics  . Alcohol use: No  . Drug use: No    Frequency: 2.0 times per week     Allergies   Penicillins and Haldol [haloperidol decanoate]   Review of Systems Review of Systems  All other systems reviewed and are negative.    Physical Exam Updated Vital Signs BP 116/72   Pulse 98   Temp 99.5 F (37.5 C)   Resp 16   SpO2 97%   Physical Exam Vitals signs and nursing note reviewed.  Constitutional:      General: She is not in acute distress.    Appearance: Normal appearance. She is well-developed. She is not ill-appearing, toxic-appearing or diaphoretic.  HENT:     Head: Normocephalic and atraumatic.     Right Ear: External ear normal.  Left Ear: External ear normal.     Mouth/Throat:     Mouth: Mucous membranes are moist.     Pharynx: No oropharyngeal exudate or posterior oropharyngeal erythema.  Eyes:     General:        Right eye: No discharge.        Left eye: No discharge.     Conjunctiva/sclera: Conjunctivae normal.     Pupils: Pupils are equal, round, and reactive to light.  Neck:     Musculoskeletal: Normal range of motion and neck supple.     Trachea: Phonation normal.  Cardiovascular:     Rate and Rhythm: Normal rate.  Pulmonary:     Effort: Pulmonary effort is normal.  Musculoskeletal: Normal range of motion.        General: No swelling or tenderness.     Comments: Mild tenderness left lateral hip and left posterior upper leg.  Normal active and passive range of motion of the left and right legs.  Skin:    General: Skin is warm and dry.     Coloration: Skin is not jaundiced or pale.  Neurological:     Mental Status: She is  alert and oriented to person, place, and time.     Cranial Nerves: No cranial nerve deficit.     Sensory: No sensory deficit.     Motor: No abnormal muscle tone.     Coordination: Coordination normal.  Psychiatric:        Mood and Affect: Mood normal.        Behavior: Behavior normal.        Thought Content: Thought content normal.        Judgment: Judgment normal.      ED Treatments / Results  Labs (all labs ordered are listed, but only abnormal results are displayed) Labs Reviewed - No data to display  EKG None  Radiology No results found.  Procedures Procedures (including critical care time)  Medications Ordered in ED Medications  ketorolac (TORADOL) injection 30 mg (has no administration in time range)     Initial Impression / Assessment and Plan / ED Course  I have reviewed the triage vital signs and the nursing notes.  Pertinent labs & imaging results that were available during my care of the patient were reviewed by me and considered in my medical decision making (see chart for details).         Patient Vitals for the past 24 hrs:  BP Temp Pulse Resp SpO2  10/21/18 1931 116/72 99.5 F (37.5 C) 98 16 97 %    Shortly after I saw the patient and ordered Toradol, she walked out without talking to the nurse.  Medical Decision Making: Left upper leg injury.  Patient left before completion of treatment, AGAINST MEDICAL ADVICE  CRITICAL CARE-no Performed by: Mancel Bale  Nursing Notes Reviewed/ Care Coordinated Applicable Imaging Reviewed Interpretation of Laboratory Data incorporated into ED treatment   Final Clinical Impressions(s) / ED Diagnoses   Final diagnoses:  Injury of left lower extremity, initial encounter    ED Discharge Orders    None       Mancel Bale, MD 10/21/18 2018

## 2018-10-21 NOTE — ED Triage Notes (Signed)
Pt c/o left hip pain after fall today, pain 7/10, 120/80, hr 96, r 18, 98% Ra, cbg 91. Hx of chronic back pain and arthritis. Able to ambulate at the scene. No shortening or rotation per EMS

## 2020-10-08 ENCOUNTER — Other Ambulatory Visit: Payer: Self-pay

## 2020-10-08 ENCOUNTER — Ambulatory Visit (HOSPITAL_COMMUNITY)
Admission: EM | Admit: 2020-10-08 | Discharge: 2020-10-08 | Disposition: A | Discharge status: Home / Self Care | Payer: Medicaid Other | Attending: Behavioral Health | Admitting: Behavioral Health

## 2020-10-08 DIAGNOSIS — Z9152 Personal history of nonsuicidal self-harm: Secondary | ICD-10-CM | POA: Diagnosis not present

## 2020-10-08 DIAGNOSIS — Z635 Disruption of family by separation and divorce: Secondary | ICD-10-CM | POA: Insufficient documentation

## 2020-10-08 DIAGNOSIS — Z9151 Personal history of suicidal behavior: Secondary | ICD-10-CM | POA: Insufficient documentation

## 2020-10-08 DIAGNOSIS — F3130 Bipolar disorder, current episode depressed, mild or moderate severity, unspecified: Secondary | ICD-10-CM | POA: Diagnosis not present

## 2020-10-08 DIAGNOSIS — Z599 Problem related to housing and economic circumstances, unspecified: Secondary | ICD-10-CM | POA: Insufficient documentation

## 2020-10-08 NOTE — Discharge Instructions (Signed)

## 2020-10-08 NOTE — ED Notes (Signed)
Pt belongings in blue color coded locker. 
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
## 2020-10-08 NOTE — ED Notes (Signed)
Pt discharged in no acute distress. A&O x4, ambulatory. Verbalized understanding of AVS instructions reviewed by RN. Belongings returned to pt intact from "blue" locker. Pt escorted to [facility] by staff into care of [facility] who will transport pt home. Safety maintained.
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
